# Patient Record
Sex: Female | Born: 1965 | Race: White | Hispanic: No | Smoking: Former smoker
Health system: Southern US, Community
[De-identification: ages and names within clinical notes are randomized; demographics above are authoritative.]

## PROBLEM LIST (undated history)

## (undated) DIAGNOSIS — K219 Gastro-esophageal reflux disease without esophagitis: Secondary | ICD-10-CM

## (undated) DIAGNOSIS — I10 Essential (primary) hypertension: Secondary | ICD-10-CM

## (undated) DIAGNOSIS — J3489 Other specified disorders of nose and nasal sinuses: Secondary | ICD-10-CM

## (undated) DIAGNOSIS — E079 Disorder of thyroid, unspecified: Secondary | ICD-10-CM

## (undated) HISTORY — PX: TUBAL LIGATION: SHX77

## (undated) HISTORY — PX: HAND SURGERY: SHX662

## (undated) HISTORY — DX: Essential (primary) hypertension: I10

---

## 2009-05-08 ENCOUNTER — Emergency Department (HOSPITAL_COMMUNITY): Admission: EM | Admit: 2009-05-08 | Discharge: 2009-05-08 | Payer: Self-pay | Admitting: Emergency Medicine

## 2010-02-01 ENCOUNTER — Emergency Department (HOSPITAL_COMMUNITY): Admission: EM | Admit: 2010-02-01 | Discharge: 2010-02-01 | Payer: Self-pay | Admitting: Emergency Medicine

## 2014-01-17 ENCOUNTER — Emergency Department (HOSPITAL_COMMUNITY): Payer: Self-pay

## 2014-01-17 ENCOUNTER — Emergency Department (HOSPITAL_COMMUNITY)
Admission: EM | Admit: 2014-01-17 | Discharge: 2014-01-17 | Disposition: A | Discharge status: Home / Self Care | Payer: Self-pay | Attending: Emergency Medicine | Admitting: Emergency Medicine

## 2014-01-17 ENCOUNTER — Encounter (HOSPITAL_COMMUNITY): Payer: Self-pay | Admitting: Emergency Medicine

## 2014-01-17 DIAGNOSIS — Z880 Allergy status to penicillin: Secondary | ICD-10-CM | POA: Insufficient documentation

## 2014-01-17 DIAGNOSIS — J069 Acute upper respiratory infection, unspecified: Secondary | ICD-10-CM | POA: Insufficient documentation

## 2014-01-17 DIAGNOSIS — M545 Low back pain, unspecified: Secondary | ICD-10-CM | POA: Insufficient documentation

## 2014-01-17 DIAGNOSIS — M5430 Sciatica, unspecified side: Secondary | ICD-10-CM | POA: Insufficient documentation

## 2014-01-17 DIAGNOSIS — F17200 Nicotine dependence, unspecified, uncomplicated: Secondary | ICD-10-CM | POA: Insufficient documentation

## 2014-01-17 MED ORDER — PREDNISONE 50 MG PO TABS
60.0000 mg | ORAL_TABLET | Freq: Once | ORAL | Status: AC
  Administered 2014-01-17: 60 mg via ORAL
  Filled 2014-01-17 (×2): qty 1

## 2014-01-17 MED ORDER — HYDROMORPHONE HCL PF 1 MG/ML IJ SOLN
1.0000 mg | Freq: Once | INTRAMUSCULAR | Status: AC
Start: 2014-01-17 — End: 2014-01-17
  Administered 2014-01-17: 1 mg via INTRAMUSCULAR
  Filled 2014-01-17: qty 1

## 2014-01-17 MED ORDER — BENZONATATE 200 MG PO CAPS: 200.0000 mg | ORAL_CAPSULE | Freq: Three times a day (TID) | ORAL | Status: DC | PRN

## 2014-01-17 MED ORDER — METHOCARBAMOL 500 MG PO TABS: 1000.0000 mg | ORAL_TABLET | Freq: Four times a day (QID) | ORAL | Status: AC | PRN

## 2014-01-17 MED ORDER — PREDNISONE 10 MG PO TABS: ORAL_TABLET | ORAL | Status: DC

## 2014-01-17 MED ORDER — BENZONATATE 100 MG PO CAPS
200.0000 mg | ORAL_CAPSULE | Freq: Once | ORAL | Status: AC
  Administered 2014-01-17: 200 mg via ORAL
  Filled 2014-01-17: qty 2

## 2014-01-17 MED ORDER — OXYCODONE-ACETAMINOPHEN 5-325 MG PO TABS: 1.0000 | ORAL_TABLET | ORAL | Status: DC | PRN

## 2014-01-17 NOTE — ED Notes (Signed)
Lower back pain worsening after back coughing spell.  Reports worsening pain radiating down leg with walking.

## 2014-01-17 NOTE — Discharge Instructions (Signed)
Cough, Adult  A cough is a reflex that helps clear your throat and airways. It can help heal the body or may be a reaction to an irritated airway. A cough may only last 2 or 3 weeks (acute) or may last more than 8 weeks (chronic).  CAUSES Acute cough:  Viral or bacterial infections. Chronic cough:  Infections.  Allergies.  Asthma.  Post-nasal drip.  Smoking.  Heartburn or acid reflux.  Some medicines.  Chronic lung problems (COPD).  Cancer. SYMPTOMS   Cough.  Fever.  Chest pain.  Increased breathing rate.  High-pitched whistling sound when breathing (wheezing).  Colored mucus that you cough up (sputum). TREATMENT   A bacterial cough may be treated with antibiotic medicine.  A viral cough must run its course and will not respond to antibiotics.  Your caregiver may recommend other treatments if you have a chronic cough. HOME CARE INSTRUCTIONS   Only take over-the-counter or prescription medicines for pain, discomfort, or fever as directed by your caregiver. Use cough suppressants only as directed by your caregiver.  Use a cold steam vaporizer or humidifier in your bedroom or home to help loosen secretions.  Sleep in a semi-upright position if your cough is worse at night.  Rest as needed.  Stop smoking if you smoke. SEEK IMMEDIATE MEDICAL CARE IF:   You have pus in your sputum.  Your cough starts to worsen.  You cannot control your cough with suppressants and are losing sleep.  You begin coughing up blood.  You have difficulty breathing.  You develop pain which is getting worse or is uncontrolled with medicine.  You have a fever. MAKE SURE YOU:   Understand these instructions.  Will watch your condition.  Will get help right away if you are not doing well or get worse. Document Released: 11/22/2010 Document Revised: 08/18/2011 Document Reviewed: 11/22/2010 [facility] Patient Information 2015 [place], [place]. This information is not intended  to replace advice given to you by your health care provider. Make sure you discuss any questions you have with your health care provider.  Sciatica Sciatica is pain, weakness, numbness, or tingling along the path of the sciatic nerve. The nerve starts in the lower back and runs down the back of each leg. The nerve controls the muscles in the lower leg and in the back of the knee, while also providing sensation to the back of the thigh, lower leg, and the sole of your foot. Sciatica is a symptom of another medical condition. For instance, nerve damage or certain conditions, such as a herniated disk or bone spur on the spine, pinch or put pressure on the sciatic nerve. This causes the pain, weakness, or other sensations normally associated with sciatica. Generally, sciatica only affects one side of the body. CAUSES   Herniated or slipped disc.  Degenerative disk disease.  A pain disorder involving the narrow muscle in the buttocks (piriformis syndrome).  Pelvic injury or fracture.  Pregnancy.  Tumor (rare). SYMPTOMS  Symptoms can vary from mild to very severe. The symptoms usually travel from the low back to the buttocks and down the back of the leg. Symptoms can include:  Mild tingling or dull aches in the lower back, leg, or hip.  Numbness in the back of the calf or sole of the foot.  Burning sensations in the lower back, leg, or hip.  Sharp pains in the lower back, leg, or hip.  Leg weakness.  Severe back pain inhibiting movement. These symptoms may get worse  with coughing, sneezing, laughing, or prolonged sitting or standing. Also, being overweight may worsen symptoms. DIAGNOSIS  Your caregiver will perform a physical exam to look for common symptoms of sciatica. He or she may ask you to do certain movements or activities that would trigger sciatic nerve pain. Other tests may be performed to find the cause of the sciatica. These may include:  Blood tests.  X-rays.  Imaging  tests, such as an MRI or CT scan. TREATMENT  Treatment is directed at the cause of the sciatic pain. Sometimes, treatment is not necessary and the pain and discomfort goes away on its own. If treatment is needed, your caregiver may suggest:  Over-the-counter medicines to relieve pain.  Prescription medicines, such as anti-inflammatory medicine, muscle relaxants, or narcotics.  Applying heat or ice to the painful area.  Steroid injections to lessen pain, irritation, and inflammation around the nerve.  Reducing activity during periods of pain.  Exercising and stretching to strengthen your abdomen and improve flexibility of your spine. Your caregiver may suggest losing weight if the extra weight makes the back pain worse.  Physical therapy.  Surgery to eliminate what is pressing or pinching the nerve, such as a bone spur or part of a herniated disk. HOME CARE INSTRUCTIONS   Only take over-the-counter or prescription medicines for pain or discomfort as directed by your caregiver.  Apply ice to the affected area for 20 minutes, 3-4 times a day for the first 48-72 hours. Then try heat in the same way.  Exercise, stretch, or perform your usual activities if these do not aggravate your pain.  Attend physical therapy sessions as directed by your caregiver.  Keep all follow-up appointments as directed by your caregiver.  Do not wear high heels or shoes that do not provide proper support.  Check your mattress to see if it is too soft. A firm mattress may lessen your pain and discomfort. SEEK IMMEDIATE MEDICAL CARE IF:   You lose control of your bowel or bladder (incontinence).  You have increasing weakness in the lower back, pelvis, buttocks, or legs.  You have redness or swelling of your back.  You have a burning sensation when you urinate.  You have pain that gets worse when you lie down or awakens you at night.  Your pain is worse than you have experienced in the past.  Your  pain is lasting longer than 4 weeks.  You are suddenly losing weight without reason. MAKE SURE YOU:  Understand these instructions.  Will watch your condition.  Will get help right away if you are not doing well or get worse. Document Released: 05/20/2001 Document Revised: 11/25/2011 Document Reviewed: 10/05/2011 [facility] Patient Information 2015 [place], [place]. This information is not intended to replace advice given to you by your health care provider. Make sure you discuss any questions you have with your health care provider.   Take your next dose of prednisone tomorrow morning.  Use the the other medicines as directed.  Do not drive within 4 hours of taking oxycodone as this will make you drowsy.  Avoid lifting,  Bending,  Twisting or any other activity that worsens your pain over the next week.  Apply an  icepack  to your lower back for 10-15 minutes every 2 hours for the next 2 days.  You should get rechecked if your symptoms are not better over the next 5 days,  Or you develop increased pain,  Weakness in your leg(s) or loss of bladder or bowel  function - these are symptoms of a worse injury.  As discussed, honey is also an excellent cough suppressant.  Stay well hydrated,  Cough lozenges,  Warm salt gargles can also help improve throat irritation.

## 2014-01-18 NOTE — ED Provider Notes (Signed)
CSN: [id number]     Arrival date & time 01/17/14  1237 History   First MD Initiated Contact with Patient 01/17/14 1256     Chief Complaint  Patient presents with  . Back Pain     (Consider location/radiation/quality/duration/timing/severity/associated sxs/prior Treatment) The history is provided by the patient and the spouse.    Melissa Meyers is a 48 y.o. female  with a history of intermittent mild low back pain presenting with severe and sudden worse low back pain after a coughing spell this morning.  She she feels her back "locked up" suddenly while coughing.  Her pain is radiating down both posterior legs to her calves but  denies weakness in her legs, nor has she had bladder or bowel retention or incontinence since this event.  Her cough has been fairly persistent this past week, dry and she denies shortness of breath.  She had URI type symptoms this past week with nasal congestion which has improved but has residual nonproductive coughing which has not improved with OTC cough medications.  She has a daily smoker.  She denies fevers or chills, chest pain or shortness of breath.           History reviewed. No pertinent past medical history. Past Surgical History  Procedure Laterality Date  . Tubal ligation     No family history on file. History  Substance Use Topics  . Smoking status: Current Every Day Smoker    Types: Cigarettes  . Smokeless tobacco: Not on file  . Alcohol Use: No   OB History   Grav Para Term Preterm Abortions TAB SAB Ect Mult Living                 Review of Systems  Constitutional: Negative for fever and chills.  Respiratory: Positive for cough. Negative for shortness of breath, wheezing and stridor.   Cardiovascular: Negative for chest pain and leg swelling.  Gastrointestinal: Negative for abdominal pain, constipation and abdominal distention.  Genitourinary: Negative for dysuria, urgency, frequency, flank pain and difficulty urinating.   Musculoskeletal: Positive for back pain. Negative for gait problem and joint swelling.  Skin: Negative for rash.  Neurological: Negative for weakness and numbness.      Allergies  Penicillins  Home Medications   Prior to Admission medications   Medication Sig Start Date End Date Taking? Authorizing Provider  benzonatate (TESSALON) 200 MG capsule Take 1 capsule (200 mg total) by mouth 3 (three) times daily as needed for cough. 01/17/14   Burgess Amor, PA-C  methocarbamol (ROBAXIN) 500 MG tablet Take 2 tablets (1,000 mg total) by mouth every 6 (six) hours as needed for muscle spasms. 01/17/14 01/27/14  Burgess Amor, PA-C  oxyCODONE-acetaminophen (PERCOCET/ROXICET) 5-325 MG per tablet Take 1 tablet by mouth every 4 (four) hours as needed. 01/17/14   Burgess Amor, PA-C  predniSONE (DELTASONE) 10 MG tablet 6, 5, 4, 3, 2 then 1 tablet by mouth daily for 6 days total. 01/17/14   Burgess Amor, PA-C   BP 123/87  Pulse 83  Temp(Src) 99 F (37.2 C) (Oral)  Resp 20  Ht 5\' 2"  (1.575 m)  Wt 136 lb (61.689 kg)  BMI 24.87 kg/m2  SpO2 98%  LMP 11/23/2013 Physical Exam  Nursing note and vitals reviewed. Constitutional: She appears well-developed and well-nourished.  HENT:  Head: Normocephalic.  Eyes: Conjunctivae are normal.  Neck: Normal range of motion. Neck supple.  Cardiovascular: Normal rate and intact distal pulses.   Pedal pulses normal.  Pulmonary/Chest:  Effort normal. She has no wheezes. She exhibits no tenderness.  Abdominal: Soft. Bowel sounds are normal. She exhibits no distension and no mass.  Musculoskeletal: Normal range of motion. She exhibits no edema.       Lumbar back: She exhibits tenderness. She exhibits no swelling, no edema and no spasm.  Lumber and bilateral paralumbar ttp without step offs, no deformity.  Muscle spasm.  Neurological: She is alert. She has normal strength. She displays no atrophy and no tremor. No sensory deficit. Gait normal.  Reflex Scores:      Patellar  reflexes are 2+ on the right side and 2+ on the left side.      Achilles reflexes are 2+ on the right side and 2+ on the left side. No strength deficit noted in hip and knee flexor and extensor muscle groups.  Ankle flexion and extension intact.  Skin: Skin is warm and dry.  Psychiatric: She has a normal mood and affect.    ED Course  Procedures (including critical care time) Labs Review Labs Reviewed - No data to display  Imaging Review Dg Chest 2 View  01/17/2014   CLINICAL DATA:  Cough  EXAM: CHEST  2 VIEW  COMPARISON:  None.  FINDINGS: Lungs are clear. Heart size and pulmonary vascularity are normal. No adenopathy. No bone lesions.  IMPRESSION: No edema or consolidation.   Electronically Signed   By: Bretta BangWilliam  Woodruff M.D.   On: 01/17/2014 14:25   Dg Lumbar Spine Complete  01/17/2014   CLINICAL DATA:  Lower back pain worse with sitting  EXAM: LUMBAR SPINE - COMPLETE 4+ VIEW  COMPARISON:  None  FINDINGS: No acute fracture or malalignment. Vertebral body heights are maintained. There may be very mild degenerative disc disease at L4-L5 and L5-S1. Normal bony mineralization. No lytic or blastic osseous lesion. Atherosclerotic calcifications present within the abdominal aorta. Visualized bowel gas pattern is unremarkable.  IMPRESSION: 1. Mild degenerative disc disease at L4-L5 and L5-S1. 2. Aortic atherosclerosis.   Electronically Signed   By: Malachy MoanHeath  McCullough M.D.   On: 01/17/2014 14:27     EKG Interpretation None      MDM   Final diagnoses:  Sciatica, unspecified laterality  URI, acute    Patients labs and/or radiological studies were viewed and considered during the medical decision making and disposition process.  No neuro deficit on exam or by history to suggest emergent or surgical presentation.  Also discussed worsened sx that should prompt immediate re-evaluation including distal weakness, bowel/bladder retention/incontinence.  Patient was prescribed Tessalon to help with  her coughing, discussed smoking cessation.  She is also prescribed a prednisone taper, Robaxin and oxycodone for low back pain.  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  She was encouraged to followup with her orthopedic specialist regarding her back pain if her symptoms persist.         Burgess AmorJulie Shannen Vernon, PA-C 01/18/14 1759

## 2014-01-19 NOTE — ED Provider Notes (Signed)
Medical screening examination/treatment/procedure(s) were performed by non-physician practitioner and as supervising physician I was immediately available for consultation/collaboration.   EKG Interpretation None        Benny LennertJoseph L Amari Burnsworth, MD 01/19/14 1930

## 2014-10-12 ENCOUNTER — Encounter (HOSPITAL_COMMUNITY): Payer: Self-pay | Admitting: Emergency Medicine

## 2014-10-12 ENCOUNTER — Emergency Department (HOSPITAL_COMMUNITY)
Admission: EM | Admit: 2014-10-12 | Discharge: 2014-10-12 | Disposition: A | Discharge status: Home / Self Care | Payer: 59 | Attending: Emergency Medicine | Admitting: Emergency Medicine

## 2014-10-12 DIAGNOSIS — Y9289 Other specified places as the place of occurrence of the external cause: Secondary | ICD-10-CM | POA: Insufficient documentation

## 2014-10-12 DIAGNOSIS — Y998 Other external cause status: Secondary | ICD-10-CM | POA: Diagnosis not present

## 2014-10-12 DIAGNOSIS — E079 Disorder of thyroid, unspecified: Secondary | ICD-10-CM | POA: Insufficient documentation

## 2014-10-12 DIAGNOSIS — Y9389 Activity, other specified: Secondary | ICD-10-CM | POA: Insufficient documentation

## 2014-10-12 DIAGNOSIS — Z880 Allergy status to penicillin: Secondary | ICD-10-CM | POA: Diagnosis not present

## 2014-10-12 DIAGNOSIS — S46912A Strain of unspecified muscle, fascia and tendon at shoulder and upper arm level, left arm, initial encounter: Secondary | ICD-10-CM | POA: Insufficient documentation

## 2014-10-12 DIAGNOSIS — S4992XA Unspecified injury of left shoulder and upper arm, initial encounter: Secondary | ICD-10-CM | POA: Diagnosis present

## 2014-10-12 DIAGNOSIS — Z720 Tobacco use: Secondary | ICD-10-CM | POA: Diagnosis not present

## 2014-10-12 DIAGNOSIS — Z79899 Other long term (current) drug therapy: Secondary | ICD-10-CM | POA: Insufficient documentation

## 2014-10-12 DIAGNOSIS — S46812A Strain of other muscles, fascia and tendons at shoulder and upper arm level, left arm, initial encounter: Secondary | ICD-10-CM

## 2014-10-12 DIAGNOSIS — X58XXXA Exposure to other specified factors, initial encounter: Secondary | ICD-10-CM | POA: Diagnosis not present

## 2014-10-12 DIAGNOSIS — M7712 Lateral epicondylitis, left elbow: Secondary | ICD-10-CM | POA: Insufficient documentation

## 2014-10-12 DIAGNOSIS — Z7951 Long term (current) use of inhaled steroids: Secondary | ICD-10-CM | POA: Diagnosis not present

## 2014-10-12 DIAGNOSIS — S59902A Unspecified injury of left elbow, initial encounter: Secondary | ICD-10-CM | POA: Insufficient documentation

## 2014-10-12 DIAGNOSIS — K219 Gastro-esophageal reflux disease without esophagitis: Secondary | ICD-10-CM | POA: Diagnosis not present

## 2014-10-12 DIAGNOSIS — J3489 Other specified disorders of nose and nasal sinuses: Secondary | ICD-10-CM | POA: Diagnosis not present

## 2014-10-12 HISTORY — DX: Other specified disorders of nose and nasal sinuses: J34.89

## 2014-10-12 HISTORY — DX: Disorder of thyroid, unspecified: E07.9

## 2014-10-12 HISTORY — DX: Gastro-esophageal reflux disease without esophagitis: K21.9

## 2014-10-12 MED ORDER — ONDANSETRON HCL 4 MG PO TABS
4.0000 mg | ORAL_TABLET | Freq: Once | ORAL | Status: DC
  Filled 2014-10-12: qty 1

## 2014-10-12 MED ORDER — HYDROCODONE-ACETAMINOPHEN 5-325 MG PO TABS: 1.0000 | ORAL_TABLET | ORAL | Status: DC | PRN

## 2014-10-12 MED ORDER — DICLOFENAC SODIUM 75 MG PO TBEC: 75.0000 mg | DELAYED_RELEASE_TABLET | Freq: Two times a day (BID) | ORAL | Status: DC

## 2014-10-12 MED ORDER — PREDNISONE 10 MG PO TABS
60.0000 mg | ORAL_TABLET | Freq: Once | ORAL | Status: DC
  Filled 2014-10-12 (×2): qty 1

## 2014-10-12 MED ORDER — METHOCARBAMOL 500 MG PO TABS: 500.0000 mg | ORAL_TABLET | Freq: Three times a day (TID) | ORAL | Status: DC

## 2014-10-12 MED ORDER — KETOROLAC TROMETHAMINE 10 MG PO TABS
10.0000 mg | ORAL_TABLET | Freq: Once | ORAL | Status: DC
  Filled 2014-10-12: qty 1
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
## 2014-10-12 NOTE — Discharge Instructions (Signed)
Please use a heating pad to your shoulder and elbow. Please use medications as prescribed. The Robaxin and Norco may cause drowsiness, please use these medications with caution. Please see Melissa Meyers as soon as possible in the office for evaluation and management of your shoulder and elbow issues. Tennis Elbow Tennis elbow can happen in any sport or job where you overuse your elbow. It is caused by doing the same motion over and over. This makes small tears in the forearm muscles. It can also cause muscle redness, soreness, and puffiness (inflammation). HOME CARE  Rest.  Use your other hand or arm that is not affected. Change your grip.  Only take medicine as told by your doctor.  Put ice on the area after activity.  Put ice in a plastic bag.  Place a towel between your skin and the bag.  Leave the ice on for 15-20 minutes, 03-04 times a day.  Wear a splint or sling as told by your doctor. This lets the area rest and heal faster. GET HELP RIGHT AWAY IF: Your pain gets worse or does not improve in 2 weeks even with treatment. MAKE SURE YOU:   Understand these instructions.  Will watch your condition.  Will get help right away if you are not doing well or get worse. Document Released: 11/13/2009 Document Revised: 08/18/2011 Document Reviewed: 11/13/2009 [facility] Patient Information 2015 [place], [place]. This information is not intended to replace advice given to you by your health care provider. Make sure you discuss any questions you have with your health care provider.  Muscle Strain A muscle strain (pulled muscle) happens when a muscle is stretched beyond normal length. It happens when a sudden, violent force stretches your muscle too far. Usually, a few of the fibers in your muscle are torn. Muscle strain is common in athletes. Recovery usually takes 1-2 weeks. Complete healing takes 5-6 weeks.  HOME CARE   Follow the PRICE method of treatment to help your injury get better.  Do this the first 2-3 days after the injury:  Protect. Protect the muscle to keep it from getting injured again.  Rest. Limit your activity and rest the injured body part.  Ice. Put ice in a plastic bag. Place a towel between your skin and the bag. Then, apply the ice and leave it on from 15-20 minutes each hour. After the third day, switch to moist heat packs.  Compression. Use a splint or elastic bandage on the injured area for comfort. Do not put it on too tightly.  Elevate. Keep the injured body part above the level of your heart.  Only take medicine as told by your doctor.  Warm up before doing exercise to prevent future muscle strains. GET HELP IF:   You have more pain or puffiness (swelling) in the injured area.  You feel numbness, tingling, or notice a loss of strength in the injured area. MAKE SURE YOU:   Understand these instructions.  Will watch your condition.  Will get help right away if you are not doing well or get worse. Document Released: 03/04/2008 Document Revised: 03/16/2013 Document Reviewed: 12/23/2012 [facility] Patient Information 2015 [place], [place]. This information is not intended to replace advice given to you by your health care provider. Make sure you discuss any questions you have with your health care provider.

## 2014-11-07 ENCOUNTER — Other Ambulatory Visit (HOSPITAL_COMMUNITY): Payer: Self-pay | Admitting: Pulmonary Disease

## 2014-11-07 ENCOUNTER — Ambulatory Visit (HOSPITAL_COMMUNITY)
Admission: RE | Admit: 2014-11-07 | Discharge: 2014-11-07 | Disposition: A | Discharge status: Home / Self Care | Payer: 59 | Source: Ambulatory Visit | Attending: Pulmonary Disease | Admitting: Pulmonary Disease

## 2014-11-07 DIAGNOSIS — M47892 Other spondylosis, cervical region: Secondary | ICD-10-CM | POA: Insufficient documentation

## 2014-11-07 DIAGNOSIS — M542 Cervicalgia: Secondary | ICD-10-CM | POA: Diagnosis present

## 2014-11-07 DIAGNOSIS — M5412 Radiculopathy, cervical region: Secondary | ICD-10-CM

## 2014-11-07 DIAGNOSIS — M47814 Spondylosis without myelopathy or radiculopathy, thoracic region: Secondary | ICD-10-CM | POA: Insufficient documentation

## 2014-11-07 DIAGNOSIS — M5134 Other intervertebral disc degeneration, thoracic region: Secondary | ICD-10-CM | POA: Insufficient documentation

## 2014-11-07 DIAGNOSIS — M25522 Pain in left elbow: Secondary | ICD-10-CM | POA: Insufficient documentation

## 2014-11-07 DIAGNOSIS — M5032 Other cervical disc degeneration, mid-cervical region: Secondary | ICD-10-CM | POA: Diagnosis not present

## 2014-12-09 ENCOUNTER — Encounter (HOSPITAL_COMMUNITY): Payer: Self-pay | Admitting: Emergency Medicine

## 2014-12-09 ENCOUNTER — Emergency Department (HOSPITAL_COMMUNITY)
Admission: EM | Admit: 2014-12-09 | Discharge: 2014-12-09 | Disposition: A | Discharge status: Home / Self Care | Payer: 59 | Attending: Emergency Medicine | Admitting: Emergency Medicine

## 2014-12-09 DIAGNOSIS — Z720 Tobacco use: Secondary | ICD-10-CM | POA: Diagnosis not present

## 2014-12-09 DIAGNOSIS — Z79899 Other long term (current) drug therapy: Secondary | ICD-10-CM | POA: Diagnosis not present

## 2014-12-09 DIAGNOSIS — E079 Disorder of thyroid, unspecified: Secondary | ICD-10-CM | POA: Insufficient documentation

## 2014-12-09 DIAGNOSIS — W228XXA Striking against or struck by other objects, initial encounter: Secondary | ICD-10-CM | POA: Insufficient documentation

## 2014-12-09 DIAGNOSIS — Y9289 Other specified places as the place of occurrence of the external cause: Secondary | ICD-10-CM | POA: Diagnosis not present

## 2014-12-09 DIAGNOSIS — Y998 Other external cause status: Secondary | ICD-10-CM | POA: Insufficient documentation

## 2014-12-09 DIAGNOSIS — Z7951 Long term (current) use of inhaled steroids: Secondary | ICD-10-CM | POA: Insufficient documentation

## 2014-12-09 DIAGNOSIS — K219 Gastro-esophageal reflux disease without esophagitis: Secondary | ICD-10-CM | POA: Diagnosis not present

## 2014-12-09 DIAGNOSIS — S0501XA Injury of conjunctiva and corneal abrasion without foreign body, right eye, initial encounter: Secondary | ICD-10-CM | POA: Diagnosis not present

## 2014-12-09 DIAGNOSIS — S0591XA Unspecified injury of right eye and orbit, initial encounter: Secondary | ICD-10-CM | POA: Diagnosis present

## 2014-12-09 DIAGNOSIS — Y9389 Activity, other specified: Secondary | ICD-10-CM | POA: Insufficient documentation

## 2014-12-09 MED ORDER — FLUORESCEIN SODIUM 1 MG OP STRP
1.0000 | ORAL_STRIP | Freq: Once | OPHTHALMIC | Status: AC
  Administered 2014-12-09: 1 via OPHTHALMIC
  Filled 2014-12-09: qty 1

## 2014-12-09 MED ORDER — HYDROCODONE-ACETAMINOPHEN 5-325 MG PO TABS: 1.0000 | ORAL_TABLET | ORAL | Status: DC | PRN

## 2014-12-09 MED ORDER — ERYTHROMYCIN 5 MG/GM OP OINT
TOPICAL_OINTMENT | Freq: Once | OPHTHALMIC | Status: AC
  Administered 2014-12-09: 1 via OPHTHALMIC
  Filled 2014-12-09: qty 3.5

## 2014-12-09 MED ORDER — TETRACAINE HCL 0.5 % OP SOLN
1.0000 [drp] | Freq: Once | OPHTHALMIC | Status: AC
  Administered 2014-12-09: 1 [drp] via OPHTHALMIC
  Filled 2014-12-09: qty 2

## 2014-12-09 NOTE — ED Notes (Signed)
Pt verbalized understanding of no driving and to use caution within 4 hours of taking pain meds due to meds cause drowsiness 

## 2014-12-09 NOTE — ED Provider Notes (Signed)
CSN: [id number]     Arrival date & time 12/09/14  1906 History   First MD Initiated Contact with Patient 12/09/14 1932     Chief Complaint  Patient presents with  . Eye Injury     (Consider location/radiation/quality/duration/timing/severity/associated sxs/prior Treatment) Patient is a 49 y.o. female presenting with eye injury. The history is provided by the patient.  Eye Injury This is a new problem. The current episode started today. The problem occurs constantly. The problem has been unchanged. Associated symptoms include a visual change. Pertinent negatives include no chest pain, chills, congestion, coughing, fever or nausea. Associated symptoms comments: Patient endorses blurred vision, increased eye watering, continued foreign body sensation which is worsened with blinking.. Exacerbated by: Blinking and bright lights. She has tried nothing for the symptoms.   Patient was walking down her driveway when she was struck in the eye by an object which flew out of a lawnmower, possibly a rock.  The injury occurred prior to arrival here.   Past Medical History  Diagnosis Date  . Acid reflux   . Sinus drainage   . Thyroid disease    Past Surgical History  Procedure Laterality Date  . Tubal ligation     No family history on file. History  Substance Use Topics  . Smoking status: Current Every Day Smoker -- 1.00 packs/day    Types: Cigarettes  . Smokeless tobacco: Not on file  . Alcohol Use: No   OB History    No data available     Review of Systems  Constitutional: Negative for fever and chills.  HENT: Positive for rhinorrhea. Negative for congestion, ear pain, facial swelling, trouble swallowing and voice change.   Eyes: Positive for photophobia, pain and visual disturbance. Negative for discharge.  Respiratory: Negative for cough, shortness of breath, wheezing and stridor.   Cardiovascular: Negative for chest pain.  Gastrointestinal: Negative for nausea.  Genitourinary:  Negative.       Allergies  Penicillins  Home Medications   Prior to Admission medications   Medication Sig Start Date End Date Taking? Authorizing Provider  fluticasone (FLONASE) 50 MCG/ACT nasal spray Place 2 sprays into both nostrils at bedtime.    Yes Historical Provider, MD  levothyroxine (SYNTHROID, LEVOTHROID) 50 MCG tablet Take 50 mcg by mouth daily before breakfast.   Yes Historical Provider, MD  omeprazole (PRILOSEC) 20 MG capsule Take 40 mg by mouth 2 (two) times daily.   Yes Historical Provider, MD  benzonatate (TESSALON) 200 MG capsule Take 1 capsule (200 mg total) by mouth 3 (three) times daily as needed for cough. Patient not taking: Reported on 10/12/2014 01/17/14   Burgess Amor, PA-C  diclofenac (VOLTAREN) 75 MG EC tablet Take 1 tablet (75 mg total) by mouth 2 (two) times daily. 10/12/14   Ivery Quale, PA-C  HYDROcodone-acetaminophen (NORCO/VICODIN) 5-325 MG per tablet Take 1 tablet by mouth every 4 (four) hours as needed. 12/09/14   Burgess Amor, PA-C  methocarbamol (ROBAXIN) 500 MG tablet Take 1 tablet (500 mg total) by mouth 3 (three) times daily. 10/12/14   Ivery Quale, PA-C  oxyCODONE-acetaminophen (PERCOCET/ROXICET) 5-325 MG per tablet Take 1 tablet by mouth every 4 (four) hours as needed. Patient not taking: Reported on 10/12/2014 01/17/14   Burgess Amor, PA-C  predniSONE (DELTASONE) 10 MG tablet 6, 5, 4, 3, 2 then 1 tablet by mouth daily for 6 days total. Patient not taking: Reported on 10/12/2014 01/17/14   Burgess Amor, PA-C   BP 131/79 mmHg  Pulse 92  Temp(Src) 98.1 F (36.7 C) (Oral)  Resp 18  SpO2 99% Physical Exam  Constitutional: She is oriented to person, place, and time. She appears well-developed and well-nourished.  HENT:  Head: Normocephalic and atraumatic.  Right Ear: Tympanic membrane and ear canal normal.  Left Ear: Tympanic membrane and ear canal normal.  Nose: Mucosal edema and rhinorrhea present.  Mouth/Throat: Uvula is midline, oropharynx is clear and  moist and mucous membranes are normal. No oropharyngeal exudate, posterior oropharyngeal edema, posterior oropharyngeal erythema or tonsillar abscesses.  Eyes: Conjunctivae and EOM are normal. Pupils are equal, round, and reactive to light.  Slit lamp exam:      The right eye shows corneal abrasion and fluorescein uptake. The right eye shows no foreign body.    Per diagram, there are 3 small areas of abrasion.  She has a round dark hyperpigmented area at the 3:00 position of her right iris.  Patient reports this is a birthmark.  Cardiovascular: Normal rate and normal heart sounds.   Pulmonary/Chest: Effort normal. No respiratory distress. She has no wheezes. She has no rales.  Abdominal: Soft. There is no tenderness.  Musculoskeletal: Normal range of motion.  Neurological: She is alert and oriented to person, place, and time.  Skin: Skin is warm and dry. No rash noted.  Psychiatric: She has a normal mood and affect.   Bilateral Near: 20/50 (pt states that she wears glasses, done without her corrective glasses in place ) ; R Near: 20/200 ; L Near: 20/30 ED Course  Procedures (including critical care time) Labs Review Labs Reviewed - No data to display  Imaging Review No results found.   EKG Interpretation None      MDM   Final diagnoses:  Corneal abrasion, right, initial encounter    Pt was placed on erythromycin ophthalmic ointment.  Prescribed hydrocodone.  She is up-to-date on her tetanus.  Advise follow-up with orthopedics early this week for recheck, referral was given to Dr. Bing PlumeHaynes.    Burgess AmorJulie Arthelia Callicott, PA-C 12/09/14 2029  Samuel JesterKathleen McManus, DO 12/12/14 [phone]

## 2014-12-09 NOTE — ED Notes (Signed)
Pt. Reports family member was mowing grass and rock flew up and hit her in the right eye.

## 2014-12-09 NOTE — Discharge Instructions (Signed)
Corneal Abrasion The cornea is the clear covering at the front and center of the eye. When looking at the colored portion of the eye (iris), you are looking through the cornea. This very thin tissue is made up of many layers. The surface layer is a single layer of cells (corneal epithelium) and is one of the most sensitive tissues in the body. If a scratch or injury causes the corneal epithelium to come off, it is called a corneal abrasion. If the injury extends to the tissues below the epithelium, the condition is called a corneal ulcer. CAUSES   Scratches.  Trauma.  Foreign body in the eye. Some people have recurrences of abrasions in the area of the original injury even after it has healed (recurrent erosion syndrome). Recurrent erosion syndrome generally improves and goes away with time. SYMPTOMS   Eye pain.  Difficulty or inability to keep the injured eye open.  The eye becomes very sensitive to light.  Recurrent erosions tend to happen suddenly, first thing in the morning, usually after waking up and opening the eye. DIAGNOSIS  Your health care provider can diagnose a corneal abrasion during an eye exam. Dye is usually placed in the eye using a drop or a small paper strip moistened by your tears. When the eye is examined with a special light, the abrasion shows up clearly because of the dye. TREATMENT   Small abrasions may be treated with antibiotic drops or ointment alone.  A pressure patch may be put over the eye. If this is done, follow your doctor's instructions for when to remove the patch. Do not drive or use machines while the eye patch is on. Judging distances is hard to do with a patch on. If the abrasion becomes infected and spreads to the deeper tissues of the cornea, a corneal ulcer can result. This is serious because it can cause corneal scarring. Corneal scars interfere with light passing through the cornea and cause a loss of vision in the involved eye. HOME CARE  INSTRUCTIONS  Use medicine or ointment as directed. Only take over-the-counter or prescription medicines for pain, discomfort, or fever as directed by your health care provider.  Do not drive or operate machinery if your eye is patched. Your ability to judge distances is impaired.  If your health care provider has given you a follow-up appointment, it is very important to keep that appointment. Not keeping the appointment could result in a severe eye infection or permanent loss of vision. If there is any problem keeping the appointment, let your health care provider know. SEEK MEDICAL CARE IF:   You have pain, light sensitivity, and a scratchy feeling in one eye or both eyes.  Your pressure patch keeps loosening up, and you can blink your eye under the patch after treatment.  Any kind of discharge develops from the eye after treatment or if the lids stick together in the morning.  You have the same symptoms in the morning as you did with the original abrasion days, weeks, or months after the abrasion healed. MAKE SURE YOU:   Understand these instructions.  Will watch your condition.  Will get help right away if you are not doing well or get worse. Document Released: 05/23/2000 Document Revised: 05/31/2013 Document Reviewed: 01/31/2013 [facility] Patient Information 2015 [place], [place]. This information is not intended to replace advice given to you by your health care provider. Make sure you discuss any questions you have with your health care provider.  Applied antibiotic  ointment to the right eye 4 times daily.  You may use the eye patch given that only for the first 24 hours.  You may take the hydrocodone prescribed for additional pain relief, but use caution as medication will make you drowsy.  Do not drive within 4 hours of taking this medication.

## 2014-12-13 ENCOUNTER — Other Ambulatory Visit (HOSPITAL_COMMUNITY): Payer: Self-pay | Admitting: Pulmonary Disease

## 2014-12-13 DIAGNOSIS — M25512 Pain in left shoulder: Principal | ICD-10-CM

## 2014-12-13 DIAGNOSIS — M25511 Pain in right shoulder: Secondary | ICD-10-CM

## 2014-12-21 MED FILL — Hydrocodone-Acetaminophen Tab 5-325 MG: ORAL | Qty: 6 | Status: AC

## 2014-12-27 ENCOUNTER — Ambulatory Visit (HOSPITAL_COMMUNITY)
Admission: RE | Admit: 2014-12-27 | Discharge: 2014-12-27 | Disposition: A | Discharge status: Home / Self Care | Payer: 59 | Source: Ambulatory Visit | Attending: Pulmonary Disease | Admitting: Pulmonary Disease

## 2014-12-27 DIAGNOSIS — M5022 Other cervical disc displacement, mid-cervical region: Secondary | ICD-10-CM | POA: Diagnosis not present

## 2014-12-27 DIAGNOSIS — M542 Cervicalgia: Secondary | ICD-10-CM | POA: Insufficient documentation

## 2014-12-27 DIAGNOSIS — M25512 Pain in left shoulder: Secondary | ICD-10-CM

## 2014-12-27 DIAGNOSIS — M25511 Pain in right shoulder: Secondary | ICD-10-CM

## 2015-01-23 ENCOUNTER — Ambulatory Visit (INDEPENDENT_AMBULATORY_CARE_PROVIDER_SITE_OTHER): Payer: 59 | Admitting: Orthopedic Surgery

## 2015-01-23 VITALS — BP 105/63 | Ht 64.0 in | Wt 146.0 lb

## 2015-01-23 DIAGNOSIS — M75102 Unspecified rotator cuff tear or rupture of left shoulder, not specified as traumatic: Secondary | ICD-10-CM

## 2015-01-23 DIAGNOSIS — M7712 Lateral epicondylitis, left elbow: Secondary | ICD-10-CM

## 2015-01-23 DIAGNOSIS — M47812 Spondylosis without myelopathy or radiculopathy, cervical region: Secondary | ICD-10-CM | POA: Diagnosis not present

## 2015-01-23 MED ORDER — NABUMETONE 500 MG PO TABS: 500.0000 mg | ORAL_TABLET | Freq: Two times a day (BID) | ORAL | Status: DC

## 2015-01-24 ENCOUNTER — Encounter: Payer: Self-pay | Admitting: Orthopedic Surgery
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
## 2015-02-01 ENCOUNTER — Ambulatory Visit (HOSPITAL_COMMUNITY): Payer: 59 | Attending: Orthopedic Surgery | Admitting: Occupational Therapy

## 2015-02-01 ENCOUNTER — Encounter (HOSPITAL_COMMUNITY): Payer: Self-pay | Admitting: Occupational Therapy

## 2015-02-01 DIAGNOSIS — M6289 Other specified disorders of muscle: Secondary | ICD-10-CM

## 2015-02-01 DIAGNOSIS — M629 Disorder of muscle, unspecified: Secondary | ICD-10-CM | POA: Diagnosis present

## 2015-02-01 DIAGNOSIS — M6281 Muscle weakness (generalized): Secondary | ICD-10-CM | POA: Diagnosis present

## 2015-02-01 DIAGNOSIS — M7582 Other shoulder lesions, left shoulder: Secondary | ICD-10-CM | POA: Insufficient documentation

## 2015-02-01 DIAGNOSIS — M25512 Pain in left shoulder: Secondary | ICD-10-CM | POA: Insufficient documentation

## 2015-02-01 DIAGNOSIS — M75102 Unspecified rotator cuff tear or rupture of left shoulder, not specified as traumatic: Secondary | ICD-10-CM | POA: Insufficient documentation

## 2015-02-01 DIAGNOSIS — M25612 Stiffness of left shoulder, not elsewhere classified: Secondary | ICD-10-CM

## 2015-02-01 NOTE — Patient Instructions (Signed)
SHOULDER: Flexion On Table   Place hands on table, elbows straight. Move hips away from body. Press hands down into table.  _10-15__ reps per set, _1-2__ sets per day  Abduction (Passive)   With arm out to side, resting on table, lower head toward arm, keeping trunk away from table.  Repeat _10-15___ times. Do _1-2___ sessions per day.  Copyright  VHI. All rights reserved.     Internal Rotation (Assistive)   Seated with elbow bent at right angle and held against side, slide arm on table surface in an inward arc. Repeat __10-15__ times. Do _1-2___ sessions per day. Activity: Use this motion to brush crumbs off the table.  Copyright  VHI. All rights reserved.    
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
## 2015-02-06 ENCOUNTER — Encounter (HOSPITAL_COMMUNITY): Payer: 59 | Admitting: Specialist

## 2015-02-08 ENCOUNTER — Ambulatory Visit (HOSPITAL_COMMUNITY): Payer: 59 | Attending: Pulmonary Disease | Admitting: Occupational Therapy

## 2015-02-08 ENCOUNTER — Encounter (HOSPITAL_COMMUNITY): Payer: Self-pay | Admitting: Occupational Therapy

## 2015-02-08 DIAGNOSIS — M25612 Stiffness of left shoulder, not elsewhere classified: Secondary | ICD-10-CM

## 2015-02-08 DIAGNOSIS — M6281 Muscle weakness (generalized): Secondary | ICD-10-CM | POA: Insufficient documentation

## 2015-02-08 DIAGNOSIS — M25512 Pain in left shoulder: Secondary | ICD-10-CM | POA: Insufficient documentation

## 2015-02-08 DIAGNOSIS — M629 Disorder of muscle, unspecified: Secondary | ICD-10-CM | POA: Diagnosis present

## 2015-02-08 DIAGNOSIS — M6289 Other specified disorders of muscle: Secondary | ICD-10-CM

## 2015-02-08 NOTE — Therapy (Signed)
[facility] [address] [place], [place], [address] Phone: [phone]   Fax:  [phone]  Occupational Therapy Treatment  Patient Details  Name: Melissa Meyers MRN: [id number] Date of Birth: [birth year] Referring Provider:  Kari Baars, MD  Encounter Date: 02/08/2015      OT End of Session - 02/08/15 1624    Visit Number 2   Number of Visits 18   Date for OT Re-Evaluation 04/02/15  Mini-reassess 03/02/2015   Authorization Type [employer]   Authorization Time Period Visit limit is 23 for OT   Authorization - Visit Number 2   Authorization - Number of Visits 23   OT Start Time 1518   OT Stop Time 1615   OT Time Calculation (min) 57 min   Activity Tolerance Patient tolerated treatment well   Behavior During Therapy [facility] for tasks assessed/performed      Past Medical History  Diagnosis Date  . Acid reflux   . Sinus drainage   . Thyroid disease     Past Surgical History  Procedure Laterality Date  . Tubal ligation      There were no vitals filed for this visit.  Visit Diagnosis:  Pain in left shoulder  Tight fascia  Muscle left arm weakness  Stiffness of left shoulder joint      Subjective Assessment - 02/08/15 1520    Subjective  S: My shoulder is very sore today, about the same as it always is.    Currently in Pain? Yes   Pain Score 7    Pain Location Shoulder   Pain Orientation Left   Pain Descriptors / Indicators Aching   Pain Type Chronic pain            [facility] OT Assessment - 02/08/15 1520    Assessment   Diagnosis left rotator cuff syndrome   Precautions   Precautions None                  OT Treatments/Exercises (OP) - 02/08/15 1521    Exercises   Exercises Shoulder   Shoulder Exercises: Supine   Protraction PROM;10 reps;AAROM;5 reps   Horizontal ABduction PROM;10 reps   External Rotation PROM;10 reps;AAROM;5 reps   Internal Rotation PROM;10 reps;AAROM;5 reps   Flexion PROM;10 reps;AAROM;5 reps   ABduction PROM;10 reps   Modalities   Modalities Electrical Stimulation;Moist Heat   Moist Heat Therapy   Number Minutes Moist Heat 15 Minutes   Moist Heat Location Shoulder   Electrical Stimulation   Electrical Stimulation Location left shoulder   Electrical Stimulation Action inferential    Electrical Stimulation Parameters 8.2 CV   Electrical Stimulation Goals Pain   Manual Therapy   Manual Therapy Myofascial release   Myofascial Release Myofascial release to left upper arm, deltoid, trapezius, and scapularis regions to decrease pain and fascial restrictions and increase joint mobility.                   OT Short Term Goals - 02/08/15 1628    OT SHORT TERM GOAL #1   Title Pt will be educated on HEP.    Time 3   Period Weeks   Status On-going   OT SHORT TERM GOAL #2   Title Pt will decrease pain to 4/10 during daily tasks.    Time 3   Period Weeks   Status On-going   OT SHORT TERM GOAL #3   Title Pt will decrease fascial restrictions from max to  mod amounts.    Time 3   Period Weeks   Status On-going   OT SHORT TERM GOAL #4   Title Pt will increase AROM to [facility] to increase ability to donn shirts without using compensatory strategies.    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    Time 3   Period Weeks   Status On-going           OT Long Term Goals - 02/08/15 1628    OT LONG TERM GOAL #1   Title Pt will return to prior level of functioning and independence during daily activities.    Time 6   Period Weeks   Status On-going   OT LONG TERM GOAL #2   Title Pt will decrease fascial restrictions from mod to min amounts or less.    Time 6   Period Weeks   Status On-going   OT LONG TERM GOAL #3   Title Pt will decrease pain to 3/10 or less during daily tasks.    Time 6   Period Weeks    Status On-going   OT LONG TERM GOAL #4   Title Pt will increase range of motion to WNL to increase ability to reach into overhead cabinets.    Time 6   Period Weeks   Status On-going   OT LONG TERM GOAL #5   Title Pt will increase strength to 4+/5 to increase ability to carry weighted objects.    Time 6   Period Weeks   Status On-going               Plan - 02/08/15 1625    Clinical Impression Statement A: Initiated myofascial release, PROM, and AAROM this session. Pt is very tender during myofascial release, especially around trapezius region. Pt demonstrates increased pain during PROM and AAROM, verbal cuing required to only push to point of discomfort, not severe pain. IFES and moist heat applied at end of session for pain management.    Plan P: Complete remaining AAROM exercises in supine if able to tolerate (horizontal abduction, abduction), increase to 10 reps if tolerable. Provide and review evaluation.         Problem List There are no active problems to display for this patient.   Ezra Sites, OTR/L  [phone]  02/08/2015, 4:30 PM  [facility] [address] [place], [place], [address] Phone: [phone]   Fax:  [phone]

## 2015-02-13 ENCOUNTER — Ambulatory Visit (HOSPITAL_COMMUNITY): Payer: 59 | Admitting: Occupational Therapy

## 2015-02-13 ENCOUNTER — Encounter (HOSPITAL_COMMUNITY): Payer: Self-pay | Admitting: Occupational Therapy

## 2015-02-13 DIAGNOSIS — M6289 Other specified disorders of muscle: Secondary | ICD-10-CM

## 2015-02-13 DIAGNOSIS — M629 Disorder of muscle, unspecified: Secondary | ICD-10-CM

## 2015-02-13 DIAGNOSIS — M6281 Muscle weakness (generalized): Secondary | ICD-10-CM

## 2015-02-13 DIAGNOSIS — M25512 Pain in left shoulder: Secondary | ICD-10-CM | POA: Diagnosis not present

## 2015-02-13 DIAGNOSIS — M25612 Stiffness of left shoulder, not elsewhere classified: Secondary | ICD-10-CM

## 2015-02-13 NOTE — Therapy (Signed)
[place] [facility] [address] [place], [place], [address] Phone: [phone]   Fax:  [phone]  Occupational Therapy Treatment  Patient Details  Name: Melissa Meyers MRN: [id number] Date of Birth: [birth year] Referring Provider:  Kari Baars, MD  Encounter Date: 02/13/2015      OT End of Session - 02/13/15 1503    Visit Number 3   Number of Visits 18   Date for OT Re-Evaluation 04/02/15  Mini-reassess 03/02/2015   Authorization Type [employer]   Authorization Time Period Visit limit is 23 for OT   Authorization - Visit Number 3   Authorization - Number of Visits 23   OT Start Time 1410   OT Stop Time 1457   OT Time Calculation (min) 47 min   Activity Tolerance Patient tolerated treatment well   Behavior During Therapy [facility] for tasks assessed/performed      Past Medical History  Diagnosis Date  . Acid reflux   . Sinus drainage   . Thyroid disease     Past Surgical History  Procedure Laterality Date  . Tubal ligation      There were no vitals filed for this visit.  Visit Diagnosis:  Pain in left shoulder  Tight fascia  Muscle left arm weakness  Stiffness of left shoulder joint      Subjective Assessment - 02/13/15 1408    Subjective  S: It feels about the same as last time.    Currently in Pain? Yes   Pain Score 7    Pain Location Shoulder   Pain Orientation Left   Pain Descriptors / Indicators Aching   Pain Type Chronic pain            [facility] OT Assessment - 02/13/15 1408    Assessment   Diagnosis left rotator cuff syndrome   Precautions   Precautions None                  OT Treatments/Exercises (OP) - 02/13/15 1412    Exercises   Exercises Shoulder   Shoulder Exercises: Supine   Protraction PROM;AAROM;10 reps   Horizontal ABduction PROM;10 reps;AAROM;5 reps   External Rotation PROM;AAROM;10 reps   Internal Rotation PROM;AAROM;10 reps   Flexion PROM;AAROM;10 reps   ABduction  PROM;AAROM;10 reps   Shoulder Exercises: Seated   Elevation AROM;10 reps   Extension AROM;10 reps   Row AROM;10 reps   Shoulder Exercises: Therapy Ball   Flexion 10 reps   ABduction 10 reps   Manual Therapy   Manual Therapy Myofascial release;Muscle Energy Technique   Myofascial Release Myofascial release to left upper arm, deltoid, trapezius, and scapularis regions to decrease pain and fascial restrictions and increase joint mobility.    Muscle Energy Technique Muscle Energy Technique to left anterior deltoid to relax tone and muscle spasm to increase joint mobility.                   OT Short Term Goals - 02/08/15 1628    OT SHORT TERM GOAL #1   Title Pt will be educated on HEP.    Time 3   Period Weeks   Status On-going   OT SHORT TERM GOAL #2   Title Pt will decrease pain to 4/10 during daily tasks.    Time 3   Period Weeks   Status On-going   OT SHORT TERM GOAL #3   Title Pt will decrease fascial restrictions from max to mod amounts.  Time 3   Period Weeks   Status On-going   OT SHORT TERM GOAL #4   Title Pt will increase AROM to [facility] to increase ability to donn shirts without using compensatory strategies.    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    Time 3   Period Weeks   Status On-going           OT Long Term Goals - 02/08/15 1628    OT LONG TERM GOAL #1   Title Pt will return to prior level of functioning and independence during daily activities.    Time 6   Period Weeks   Status On-going   OT LONG TERM GOAL #2   Title Pt will decrease fascial restrictions from mod to min amounts or less.    Time 6   Period Weeks   Status On-going   OT LONG TERM GOAL #3   Title Pt will decrease pain to 3/10 or less during daily tasks.    Time 6   Period Weeks   Status On-going   OT LONG TERM GOAL #4   Title Pt will  increase range of motion to WNL to increase ability to reach into overhead cabinets.    Time 6   Period Weeks   Status On-going   OT LONG TERM GOAL #5   Title Pt will increase strength to 4+/5 to increase ability to carry weighted objects.    Time 6   Period Weeks   Status On-going               Plan - 02/13/15 1503    Clinical Impression Statement A: Added horizontal abduction and abduction in supine this session, pt completed 10 repetitions of each exercise with the exception of horizontal abduction. Added muscle energy technique, during which therapist noted slight relaxation of anterior deltoid. Pt continues to experience increased pain and tightness during exercises, report IFES did not help in previous session.    Plan P: Continue AAROM exercises. Attempt to complete 10 repetitions of all exercises, follow-up on pain/soreness from previous session. Provide and review evaluation.         Problem List There are no active problems to display for this patient.   Ezra Sites, OTR/L  [phone]  02/13/2015, 3:07 PM  [facility] [address] [place], [place], [address] Phone: [phone]   Fax:  [phone]

## 2015-02-15 ENCOUNTER — Encounter (HOSPITAL_COMMUNITY): Payer: Self-pay | Admitting: Occupational Therapy

## 2015-02-15 ENCOUNTER — Ambulatory Visit (HOSPITAL_COMMUNITY): Payer: 59 | Admitting: Occupational Therapy

## 2015-02-15 DIAGNOSIS — M629 Disorder of muscle, unspecified: Secondary | ICD-10-CM

## 2015-02-15 DIAGNOSIS — M6289 Other specified disorders of muscle: Secondary | ICD-10-CM

## 2015-02-15 DIAGNOSIS — M6281 Muscle weakness (generalized): Secondary | ICD-10-CM

## 2015-02-15 DIAGNOSIS — M25512 Pain in left shoulder: Secondary | ICD-10-CM | POA: Diagnosis not present

## 2015-02-15 DIAGNOSIS — M25612 Stiffness of left shoulder, not elsewhere classified: Secondary | ICD-10-CM

## 2015-02-15 NOTE — Therapy (Signed)
[facility] [address] [place], [place], [address] Phone: [phone]   Fax:  [phone]  Occupational Therapy Treatment  Patient Details  Name: Melissa Meyers MRN: [id number] Date of Birth: 28-Jan-1966 Referring Provider:  Kari Baars, MD  Encounter Date: 02/15/2015      OT End of Session - 02/15/15 1616    Visit Number 4   Number of Visits 18   Date for OT Re-Evaluation 04/02/15  Mini-reassess 03/02/2015   Authorization Type [employer]   Authorization Time Period Visit limit is 23 for OT   Authorization - Visit Number 4   Authorization - Number of Visits 23   OT Start Time 1515   OT Stop Time 1607   OT Time Calculation (min) 52 min   Activity Tolerance Patient tolerated treatment well   Behavior During Therapy [facility] for tasks assessed/performed      Past Medical History  Diagnosis Date  . Acid reflux   . Sinus drainage   . Thyroid disease     Past Surgical History  Procedure Laterality Date  . Tubal ligation      There were no vitals filed for this visit.  Visit Diagnosis:  Pain in left shoulder  Tight fascia  Muscle left arm weakness  Stiffness of left shoulder joint      Subjective Assessment - 02/15/15 1516    Subjective  S: After it calmed down there was no lasting soreness after last session.    Currently in Pain? Yes   Pain Score 7    Pain Location Shoulder   Pain Orientation Left   Pain Descriptors / Indicators Aching   Pain Type Acute pain            [facility] OT Assessment - 02/15/15 1516    Assessment   Diagnosis left rotator cuff syndrome   Precautions   Precautions None                  OT Treatments/Exercises (OP) - 02/15/15 1519    Exercises   Exercises Shoulder   Shoulder Exercises: Supine   Protraction PROM;AAROM;10 reps   Horizontal ABduction PROM;AAROM;10 reps   External Rotation PROM;AAROM;10 reps   Internal Rotation PROM;AAROM;10 reps   Flexion  PROM;AAROM;10 reps   ABduction PROM;AAROM;10 reps   Shoulder Exercises: Seated   Elevation AROM;10 reps   Extension AROM;10 reps   Row AROM;10 reps   Shoulder Exercises: Pulleys   Flexion 1 minute   ABduction 1 minute   Manual Therapy   Manual Therapy Myofascial release;Muscle Energy Technique   Myofascial Release Myofascial release to left upper arm, deltoid, trapezius, and scapularis regions to decrease pain and fascial restrictions and increase joint mobility.    Muscle Energy Technique Muscle Energy Technique to left anterior deltoid to relax tone and muscle spasm to increase joint mobility.                   OT Short Term Goals - 02/08/15 1628    OT SHORT TERM GOAL #1   Title Pt will be educated on HEP.    Time 3   Period Weeks   Status On-going   OT SHORT TERM GOAL #2   Title Pt will decrease pain to 4/10 during daily tasks.    Time 3   Period Weeks   Status On-going   OT SHORT TERM GOAL #3   Title Pt will decrease fascial restrictions from max to mod amounts.  Time 3   Period Weeks   Status On-going   OT SHORT TERM GOAL #4   Title Pt will increase AROM to [facility] to increase ability to donn shirts without using compensatory strategies.    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    Time 3   Period Weeks   Status On-going           OT Long Term Goals - 02/08/15 1628    OT LONG TERM GOAL #1   Title Pt will return to prior level of functioning and independence during daily activities.    Time 6   Period Weeks   Status On-going   OT LONG TERM GOAL #2   Title Pt will decrease fascial restrictions from mod to min amounts or less.    Time 6   Period Weeks   Status On-going   OT LONG TERM GOAL #3   Title Pt will decrease pain to 3/10 or less during daily tasks.    Time 6   Period Weeks   Status On-going   OT LONG TERM  GOAL #4   Title Pt will increase range of motion to WNL to increase ability to reach into overhead cabinets.    Time 6   Period Weeks   Status On-going   OT LONG TERM GOAL #5   Title Pt will increase strength to 4+/5 to increase ability to carry weighted objects.    Time 6   Period Weeks   Status On-going               Plan - 02/15/15 1616    Clinical Impression Statement A: Added pulleys, completed 10 repetitions of all AAROM exercises. Pt reports her shoulder is feeling the same, no change in pain or soreness. Pt required verbal cuing to relax arm during manual stretching.    Plan P: Resume therapy ball. Cont AAROM, add proximal shoulder strengthening if able to tolerate.         Problem List There are no active problems to display for this patient.   Ezra Sites, OTR/L  [phone]  02/15/2015, 4:18 PM  [facility] [address] [place], [place], [address] Phone: [phone]   Fax:  [phone]

## 2015-02-20 ENCOUNTER — Encounter (HOSPITAL_COMMUNITY): Payer: Self-pay | Admitting: Occupational Therapy

## 2015-02-20 ENCOUNTER — Ambulatory Visit (HOSPITAL_COMMUNITY): Payer: 59 | Admitting: Occupational Therapy

## 2015-02-20 DIAGNOSIS — M25512 Pain in left shoulder: Secondary | ICD-10-CM | POA: Diagnosis not present

## 2015-02-20 DIAGNOSIS — M6281 Muscle weakness (generalized): Secondary | ICD-10-CM

## 2015-02-20 DIAGNOSIS — M6289 Other specified disorders of muscle: Secondary | ICD-10-CM

## 2015-02-20 DIAGNOSIS — M25612 Stiffness of left shoulder, not elsewhere classified: Secondary | ICD-10-CM

## 2015-02-20 DIAGNOSIS — M629 Disorder of muscle, unspecified: Secondary | ICD-10-CM

## 2015-02-20 NOTE — Therapy (Signed)
[facility] [address] [place], [place], [address] Phone: [phone]   Fax:  [phone]  Occupational Therapy Treatment  Patient Details  Name: Melissa Meyers MRN: [id number] Date of Birth: [birth year] Referring Provider:  Vickki Hearing, MD  Encounter Date: 02/20/2015      OT End of Session - 02/20/15 1620    Visit Number 5   Number of Visits 18   Date for OT Re-Evaluation 04/02/15  Mini-reassess 03/02/2015   Authorization Type [employer]   Authorization Time Period Visit limit is 23 for OT   Authorization - Visit Number 5   Authorization - Number of Visits 23   OT Start Time 1520   OT Stop Time 1620   OT Time Calculation (min) 60 min   Activity Tolerance Patient tolerated treatment well   Behavior During Therapy [facility] for tasks assessed/performed      Past Medical History  Diagnosis Date  . Acid reflux   . Sinus drainage   . Thyroid disease     Past Surgical History  Procedure Laterality Date  . Tubal ligation      There were no vitals filed for this visit.  Visit Diagnosis:  Pain in left shoulder  Tight fascia  Muscle left arm weakness  Stiffness of left shoulder joint      Subjective Assessment - 02/20/15 1521    Subjective  S: It's feeling pretty much the same as usual.    Currently in Pain? Yes   Pain Score 7    Pain Location Shoulder   Pain Orientation Left   Pain Descriptors / Indicators Aching   Pain Type Acute pain            [facility] OT Assessment - 02/20/15 1618    Assessment   Diagnosis left rotator cuff syndrome   Precautions   Precautions None                  OT Treatments/Exercises (OP) - 02/20/15 1522    Exercises   Exercises Shoulder   Shoulder Exercises: Supine   Protraction PROM;AAROM;10 reps   Horizontal ABduction PROM;AAROM;10 reps   External Rotation PROM;AAROM;10 reps   Internal Rotation PROM;AAROM;10 reps   Flexion PROM;AAROM;10 reps   ABduction PROM;AAROM;10 reps   Shoulder Exercises: Seated   Elevation AROM;10 reps   Extension AROM;10 reps   Row AROM;10 reps   Shoulder Exercises: Pulleys   Flexion 1 minute   ABduction 1 minute   Modalities   Modalities Electrical Stimulation;Moist Heat   Moist Heat Therapy   Number Minutes Moist Heat 10 Minutes   Moist Heat Location Shoulder   Electrical Stimulation   Electrical Stimulation Location left shoulder   Electrical Stimulation Action interferential   Electrical Stimulation Parameters 7.4 CV   Electrical Stimulation Goals Pain   Manual Therapy   Manual Therapy Myofascial release;Muscle Energy Technique   Myofascial Release Myofascial release to left upper arm, deltoid, trapezius, and scapularis regions to decrease pain and fascial restrictions and increase joint mobility.    Muscle Energy Technique Muscle Energy Technique to left anterior deltoid to relax tone and muscle spasm to increase joint mobility.                   OT Short Term Goals - 02/08/15 1628    OT SHORT TERM GOAL #1   Title Pt will be educated on HEP.    Time 3   Period Weeks   Status  On-going   OT SHORT TERM GOAL #2   Title Pt will decrease pain to 4/10 during daily tasks.    Time 3   Period Weeks   Status On-going   OT SHORT TERM GOAL #3   Title Pt will decrease fascial restrictions from max to mod amounts.    Time 3   Period Weeks   Status On-going   OT SHORT TERM GOAL #4   Title Pt will increase AROM to [facility] to increase ability to donn shirts without using compensatory strategies.    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    Time 3   Period Weeks   Status On-going           OT Long Term Goals - 02/08/15 1628    OT LONG TERM GOAL #1   Title Pt will return to prior level of functioning and independence during daily activities.    Time 6    Period Weeks   Status On-going   OT LONG TERM GOAL #2   Title Pt will decrease fascial restrictions from mod to min amounts or less.    Time 6   Period Weeks   Status On-going   OT LONG TERM GOAL #3   Title Pt will decrease pain to 3/10 or less during daily tasks.    Time 6   Period Weeks   Status On-going   OT LONG TERM GOAL #4   Title Pt will increase range of motion to WNL to increase ability to reach into overhead cabinets.    Time 6   Period Weeks   Status On-going   OT LONG TERM GOAL #5   Title Pt will increase strength to 4+/5 to increase ability to carry weighted objects.    Time 6   Period Weeks   Status On-going               Plan - 02/20/15 1630    Clinical Impression Statement A: Continued AAROM exercises this session. Pt continues to be pain limited during PROM and AAROM exercises. Pt reports no change in pain level or shoulder function on arrival. OT noted decreased fascial restrictions along left upper arm. Focus of session on relaxing arm and decreasing guarding tendencies.    Plan P: Add AAROM in sitting, proximal shoulder strengthening in supine if able to tolerate. Relaxation techniques.         Problem List There are no active problems to display for this patient.   Ezra Sites, OTR/L  [phone]  02/20/2015, 4:33 PM  [facility] [address] [place], [place], [address] Phone: [phone]   Fax:  [phone]

## 2015-02-22 ENCOUNTER — Encounter (HOSPITAL_COMMUNITY): Payer: Self-pay

## 2015-02-22 ENCOUNTER — Ambulatory Visit (HOSPITAL_COMMUNITY): Payer: 59

## 2015-02-22 DIAGNOSIS — M629 Disorder of muscle, unspecified: Secondary | ICD-10-CM

## 2015-02-22 DIAGNOSIS — M6281 Muscle weakness (generalized): Secondary | ICD-10-CM

## 2015-02-22 DIAGNOSIS — M25512 Pain in left shoulder: Secondary | ICD-10-CM | POA: Diagnosis not present

## 2015-02-22 DIAGNOSIS — M25612 Stiffness of left shoulder, not elsewhere classified: Secondary | ICD-10-CM

## 2015-02-22 DIAGNOSIS — M6289 Other specified disorders of muscle: Secondary | ICD-10-CM

## 2015-02-22 NOTE — Therapy (Signed)
[facility] [address] [place], [place], [address] Phone: [phone]   Fax:  [phone]  Occupational Therapy Treatment  Patient Details  Name: Melissa Meyers MRN: [id number] Date of Birth: 08-Jul-1965 Referring Provider:  Kari Baars, MD  Encounter Date: 02/22/2015      OT End of Session - 02/22/15 1701    Visit Number 6   Number of Visits 18   Date for OT Re-Evaluation 04/02/15  Mini-reassess 03/02/2015   Authorization Type [employer]   Authorization Time Period Visit limit is 23 for OT   Authorization - Visit Number 6   Authorization - Number of Visits 23   OT Start Time 1520   OT Stop Time 1605   OT Time Calculation (min) 45 min   Activity Tolerance Patient tolerated treatment well   Behavior During Therapy [facility] for tasks assessed/performed      Past Medical History  Diagnosis Date  . Acid reflux   . Sinus drainage   . Thyroid disease     Past Surgical History  Procedure Laterality Date  . Tubal ligation      There were no vitals filed for this visit.  Visit Diagnosis:  Pain in left shoulder  Tight fascia  Muscle left arm weakness  Stiffness of left shoulder joint      Subjective Assessment - 02/22/15 1658    Subjective  S: It's feeling pretty much the same as usual.    Currently in Pain? Yes   Pain Score 7    Pain Location Shoulder   Pain Orientation Left   Pain Descriptors / Indicators Aching   Pain Type Acute pain                      OT Treatments/Exercises (OP) - 02/22/15 1558    Exercises   Exercises Neck;Shoulder   Neck Exercises: Stretches   Upper Trapezius Stretch 2 reps;10 seconds   Shoulder Exercises: Supine   Protraction PROM;AAROM;10 reps   Horizontal ABduction PROM;10 reps   External Rotation PROM;10 reps   Internal Rotation PROM;10 reps   Flexion PROM;10 reps   ABduction PROM;10 reps   Shoulder Exercises: Seated   Retraction AROM;10 reps   Shoulder  Exercises: Stretch   Other Shoulder Stretches Posterior capsule stretch; 2 reps; 10 seconds   Manual Therapy   Manual Therapy Myofascial release;Muscle Energy Technique;Manual Traction   Manual therapy comments 1st rib manipulation completed to left side.    Myofascial Release Myofascial release to left upper arm, deltoid, trapezius, and scapularis regions to decrease pain and fascial restrictions and increase joint mobility.    Manual Traction Gentle manual traction applied to left UE.    Muscle Energy Technique Muscle Energy Technique to left anterior deltoid to relax tone and muscle spasm to increase joint mobility.                   OT Short Term Goals - 02/08/15 1628    OT SHORT TERM GOAL #1   Title Pt will be educated on HEP.    Time 3   Period Weeks   Status On-going   OT SHORT TERM GOAL #2   Title Pt will decrease pain to 4/10 during daily tasks.    Time 3   Period Weeks   Status On-going   OT SHORT TERM GOAL #3   Title Pt will decrease fascial restrictions from max to mod amounts.    Time 3  Period Weeks   Status On-going   OT SHORT TERM GOAL #4   Title Pt will increase AROM to [facility] to increase ability to donn shirts without using compensatory strategies.    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    Time 3   Period Weeks   Status On-going           OT Long Term Goals - 02/08/15 1628    OT LONG TERM GOAL #1   Title Pt will return to prior level of functioning and independence during daily activities.    Time 6   Period Weeks   Status On-going   OT LONG TERM GOAL #2   Title Pt will decrease fascial restrictions from mod to min amounts or less.    Time 6   Period Weeks   Status On-going   OT LONG TERM GOAL #3   Title Pt will decrease pain to 3/10 or less during daily tasks.    Time 6   Period Weeks   Status On-going    OT LONG TERM GOAL #4   Title Pt will increase range of motion to WNL to increase ability to reach into overhead cabinets.    Time 6   Period Weeks   Status On-going   OT LONG TERM GOAL #5   Title Pt will increase strength to 4+/5 to increase ability to carry weighted objects.    Time 6   Period Weeks   Status On-going               Plan - 02/22/15 1701    Clinical Impression Statement A: Focused session on pain management. Patient presents with increased edema in LUE starting at elbow and working up towards shoulder. Max fascial restrictions in upper arm to upper trapezius. Discussed with patient the possible causes of pain and what may be going on to cause her so much. Pt reports burning behind her left scapula, numbness and tingling in her left hand and extreme pian with movement of her shoulder.    Plan P: Add AA/ROM seated, proximal shoulder strengthening supine. update HEP. Add cervical traction.         Problem List There are no active problems to display for this patient.   Limmie Patricia, OTR/L,CBIS  [phone]  02/22/2015, 5:07 PM  [place] [facility] [address] [place], [place], [address] Phone: [phone]   Fax:  [phone]

## 2015-02-27 ENCOUNTER — Encounter (HOSPITAL_COMMUNITY): Payer: Self-pay | Admitting: Occupational Therapy

## 2015-02-27 ENCOUNTER — Ambulatory Visit (HOSPITAL_COMMUNITY): Payer: 59 | Admitting: Occupational Therapy

## 2015-02-27 ENCOUNTER — Encounter (HOSPITAL_COMMUNITY): Payer: 59

## 2015-02-27 DIAGNOSIS — M6281 Muscle weakness (generalized): Secondary | ICD-10-CM

## 2015-02-27 DIAGNOSIS — M629 Disorder of muscle, unspecified: Secondary | ICD-10-CM

## 2015-02-27 DIAGNOSIS — M6289 Other specified disorders of muscle: Secondary | ICD-10-CM

## 2015-02-27 DIAGNOSIS — M25512 Pain in left shoulder: Secondary | ICD-10-CM

## 2015-02-27 DIAGNOSIS — M25612 Stiffness of left shoulder, not elsewhere classified: Secondary | ICD-10-CM

## 2015-02-27 NOTE — Therapy (Signed)
[facility] [address] [place], [place], [address] Phone: [phone]   Fax:  [phone]  Occupational Therapy Treatment  Patient Details  Name: Melissa Meyers MRN: [id number] Date of Birth: 08-28-1965 Referring Provider:  Kari Baars, MD  Encounter Date: 02/27/2015      OT End of Session - 02/27/15 1532    Visit Number 7   Number of Visits 18   Date for OT Re-Evaluation 04/02/15  mini reassess 9/23   Authorization Type [employer]   Authorization Time Period Visit limit is 23 for OT   Authorization - Visit Number 7   Authorization - Number of Visits 23   OT Start Time 1345   OT Stop Time 1430   OT Time Calculation (min) 45 min   Activity Tolerance Patient tolerated treatment well   Behavior During Therapy [facility] for tasks assessed/performed      Past Medical History  Diagnosis Date  . Acid reflux   . Sinus drainage   . Thyroid disease     Past Surgical History  Procedure Laterality Date  . Tubal ligation      There were no vitals filed for this visit.  Visit Diagnosis:  Pain in left shoulder  Tight fascia  Muscle left arm weakness  Stiffness of left shoulder joint      Subjective Assessment - 02/27/15 1347    Subjective  S: It's about the same, no change really.    Currently in Pain? Yes   Pain Score 7    Pain Location Shoulder   Pain Orientation Left   Pain Descriptors / Indicators Aching   Pain Type Acute pain            [facility] OT Assessment - 02/27/15 1527    Assessment   Diagnosis left rotator cuff syndrome   Precautions   Precautions None                  OT Treatments/Exercises (OP) - 02/27/15 1348    Exercises   Exercises Neck;Shoulder   Shoulder Exercises: Supine   Protraction PROM;10 reps   Horizontal ABduction PROM;10 reps   External Rotation PROM;10 reps   Internal Rotation PROM;10 reps   Flexion PROM;10 reps   ABduction PROM;10 reps   Shoulder Exercises:  Standing   Protraction AAROM;10 reps   Horizontal ABduction AAROM;10 reps   External Rotation AAROM;10 reps   Internal Rotation AAROM;10 reps   Flexion AAROM;10 reps   ABduction AAROM;10 reps   Manual Therapy   Manual Therapy Myofascial release;Muscle Energy Technique;Manual Traction   Myofascial Release Myofascial release to left upper arm, deltoid, trapezius, and scapularis regions to decrease pain and fascial restrictions and increase joint mobility. Myofascial rebounding in prone to relax tone and increase LUE mobility; 2 min total.    Muscle Energy Technique Muscle Energy Technique to left anterior deltoid to relax tone and muscle spasm to increase joint mobility.                 OT Education - 02/27/15 1532    Education provided Yes   Education Details AAROM exercises    Person(s) Educated Patient   Methods Explanation;Demonstration;Handout   Comprehension Verbalized understanding;Returned demonstration          OT Short Term Goals - 02/08/15 1628    OT SHORT TERM GOAL #1   Title Pt will be educated on HEP.    Time 3   Period Weeks   Status  On-going   OT SHORT TERM GOAL #2   Title Pt will decrease pain to 4/10 during daily tasks.    Time 3   Period Weeks   Status On-going   OT SHORT TERM GOAL #3   Title Pt will decrease fascial restrictions from max to mod amounts.    Time 3   Period Weeks   Status On-going   OT SHORT TERM GOAL #4   Title Pt will increase AROM to [employer] to increase ability to donn shirts without using compensatory strategies.    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    Time 3   Period Weeks   Status On-going           OT Long Term Goals - 02/08/15 1628    OT LONG TERM GOAL #1   Title Pt will return to prior level of functioning and independence during daily activities.    Time 6   Period Weeks    Status On-going   OT LONG TERM GOAL #2   Title Pt will decrease fascial restrictions from mod to min amounts or less.    Time 6   Period Weeks   Status On-going   OT LONG TERM GOAL #3   Title Pt will decrease pain to 3/10 or less during daily tasks.    Time 6   Period Weeks   Status On-going   OT LONG TERM GOAL #4   Title Pt will increase range of motion to WNL to increase ability to reach into overhead cabinets.    Time 6   Period Weeks   Status On-going   OT LONG TERM GOAL #5   Title Pt will increase strength to 4+/5 to increase ability to carry weighted objects.    Time 6   Period Weeks   Status On-going               Plan - 02/27/15 1533    Clinical Impression Statement A: Myofascial rebounding technique added this session to relax tone and increase LUE range of motion. Pt continues to report no change in pain, and experiences max fascial restrictions in upper arm to trapezius. Added seated AAROM exercises, no reports of increased pain. HEP updated for AAROM in supine & standing.    Plan P: Follow up on HEP. Add cervical traction. Mini-reassessment        Problem List There are no active problems to display for this patient.   Ezra Sites, OTR/L  [phone]  02/27/2015, 3:36 PM  [facility] [address] [place], [place], [address] Phone: [phone]   Fax:  [phone]

## 2015-02-27 NOTE — Patient Instructions (Signed)
Perform each exercise ___10_____ reps. 2x day.   Protraction - STANDING  Start by holding a wand or cane at chest height.  Next, slowly push the wand outwards in front of your body so that your elbows become fully straightened. Then, return to the original position.     Shoulder FLEXION - STANDING - PALMS UP  In the standing position, hold a wand/cane with both arms, palms up on both sides. Raise up the wand/cane allowing your unaffected arm to perform most of the effort. Your affected arm should be partially relaxed.      Internal/External ROTATION - STANDING  In the standing position, hold a wand/cane with both hands keeping your elbows bent. Move your arms and wand/cane to one side.  Your affected arm should be partially relaxed while your unaffected arm performs most of the effort.       Shoulder ABDUCTION - STANDING  While holding a wand/cane palm face up on the injured side and palm face down on the uninjured side, slowly raise up your injured arm to the side.                     Horizontal Abduction/Adduction   Straight arms holding cane at shoulder height, bring cane to right, center, left. Repeat starting to left.   Copyright  [employer]. All rights reserved.

## 2015-03-01 ENCOUNTER — Ambulatory Visit (HOSPITAL_COMMUNITY): Payer: 59 | Admitting: Occupational Therapy

## 2015-03-01 ENCOUNTER — Encounter (HOSPITAL_COMMUNITY): Payer: Self-pay | Admitting: Occupational Therapy

## 2015-03-01 DIAGNOSIS — M629 Disorder of muscle, unspecified: Secondary | ICD-10-CM

## 2015-03-01 DIAGNOSIS — M6289 Other specified disorders of muscle: Secondary | ICD-10-CM

## 2015-03-01 DIAGNOSIS — M25512 Pain in left shoulder: Secondary | ICD-10-CM

## 2015-03-01 DIAGNOSIS — M25612 Stiffness of left shoulder, not elsewhere classified: Secondary | ICD-10-CM

## 2015-03-01 DIAGNOSIS — M6281 Muscle weakness (generalized): Secondary | ICD-10-CM

## 2015-03-01 NOTE — Therapy (Addendum)
[place] [address] [place], [place], [address] Phone: [phone]   Fax:  [phone]  Occupational Therapy Reassessment and Treatment  Patient Details  Name: Melissa Meyers MRN: [id number] Date of Birth: May 10, 1966 Referring Provider:  Sinda Du, MD  Encounter Date: 03/01/2015      OT End of Session - 03/01/15 1555    Visit Number 8   Number of Visits 18   Date for OT Re-Evaluation 04/02/15   Authorization Type [place] Time Period Visit limit is 23 for OT   Authorization - Visit Number 8   Authorization - Number of Visits 23   OT Start Time 9476   OT Stop Time 1550   OT Time Calculation (min) 32 min   Activity Tolerance Patient tolerated treatment well   Behavior During Therapy [facility] for tasks assessed/performed      Past Medical History  Diagnosis Date  . Acid reflux   . Sinus drainage   . Thyroid disease     Past Surgical History  Procedure Laterality Date  . Tubal ligation      There were no vitals filed for this visit.  Visit Diagnosis:  Pain in left shoulder  Tight fascia  Muscle left arm weakness  Stiffness of left shoulder joint      Subjective Assessment - 03/01/15 1523    Subjective  S: There's still no change, still painful.    Currently in Pain? Yes   Pain Score 7    Pain Location Shoulder   Pain Orientation Left   Pain Descriptors / Indicators Aching   Pain Type Acute pain            [facility] OT Assessment - 03/01/15 1540    Assessment   Diagnosis left rotator cuff syndrome   Precautions   Precautions None   Palpation   Palpation comment Max fascial restrictions in left upper arm, deltoid, trapezius, and scapularis regions.    AROM   Overall AROM Comments Assessed in supine, ER/IR adducted   AROM Assessment Site Shoulder   Right/Left Shoulder Left   Left Shoulder Flexion 120 Degrees  previous 108   Left Shoulder ABduction 92 Degrees  previous 86    Left Shoulder Internal Rotation 90 Degrees  previous 80   Left Shoulder External Rotation 45 Degrees  previous 30   PROM   Overall PROM Comments Assessed in supine, ER/IR adducted   PROM Assessment Site Shoulder   Right/Left Shoulder Left   Left Shoulder Flexion 150 Degrees  previous 124   Left Shoulder ABduction 111 Degrees  previous 98   Left Shoulder Internal Rotation 90 Degrees  previous 88   Left Shoulder External Rotation 78 Degrees  previous 42   Strength   Overall Strength Comments Assessed in sitting, ER/IR adducted   Strength Assessment Site Shoulder   Right/Left Shoulder Left   Left Shoulder Flexion 3/5  previously unassessed   Left Shoulder ABduction 3/5  previously unassessed   Left Shoulder Internal Rotation 3/5  previously unassessed   Left Shoulder External Rotation 3/5  previously unassessed                  OT Treatments/Exercises (OP) - 03/01/15 1524    Exercises   Exercises Neck;Shoulder   Shoulder Exercises: Supine   Protraction PROM;10 reps   Horizontal ABduction PROM;10 reps   External Rotation PROM;10 reps   Internal Rotation PROM;10 reps   Flexion PROM;10 reps  ABduction PROM;10 reps   Manual Therapy   Manual Therapy Myofascial release;Muscle Energy Technique;Manual Traction   Myofascial Release Myofascial release to left upper arm, deltoid, trapezius, and scapularis regions to decrease pain and fascial restrictions and increase joint mobility. Myofascial rebounding in prone to relax tone and increase LUE mobility; 2 min total.    Muscle Energy Technique Muscle Energy Technique to left anterior deltoid to relax tone and muscle spasm to increase joint mobility.                   OT Short Term Goals - 02/08/15 1628    OT SHORT TERM GOAL #1   Title Pt will be educated on HEP.    Time 3   Period Weeks   Status On-going   OT SHORT TERM GOAL #2   Title Pt will decrease pain to 4/10 during daily tasks.    Time 3    Period Weeks   Status On-going   OT SHORT TERM GOAL #3   Title Pt will decrease fascial restrictions from max to mod amounts.    Time 3   Period Weeks   Status On-going   OT SHORT TERM GOAL #4   Title Pt will increase AROM to [facility] to increase ability to donn shirts without using compensatory strategies.    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    Time 3   Period Weeks   Status On-going           OT Long Term Goals - 02/08/15 1628    OT LONG TERM GOAL #1   Title Pt will return to prior level of functioning and independence during daily activities.    Time 6   Period Weeks   Status On-going   OT LONG TERM GOAL #2   Title Pt will decrease fascial restrictions from mod to min amounts or less.    Time 6   Period Weeks   Status On-going   OT LONG TERM GOAL #3   Title Pt will decrease pain to 3/10 or less during daily tasks.    Time 6   Period Weeks   Status On-going   OT LONG TERM GOAL #4   Title Pt will increase range of motion to WNL to increase ability to reach into overhead cabinets.    Time 6   Period Weeks   Status On-going   OT LONG TERM GOAL #5   Title Pt will increase strength to 4+/5 to increase ability to carry weighted objects.    Time 6   Period Weeks   Status On-going               Plan - 03/01/15 1555    Clinical Impression Statement A: Mini reassessment completed this session. Pt has not met any therapy goals thus far, some progress has been made regarding increased range of motion and strength. Pt continues to experience pain at 7/10 or greater with no relief during or outside of therapy. Pt is very guarded during exercises, increased pain experienced this session during PROM exercises. Discussed options with pt, deciding to hold therapy until she is able to speak with MD to determine if she should continue therapy or  proceed with alternative testing/options.    Plan P: Hold therapy until further instructions from MD.  Problem List There are no active problems to display for this patient.   Guadelupe Sabin, OTR/L  [phone]  03/01/2015, 4:01 PM  [place], [place], [address] Phone: [phone]   Fax:  [phone]   OCCUPATIONAL THERAPY DISCHARGE SUMMARY  Visits from Start of Care: 8  Current functional level related to goals / functional outcomes: OT LONG TERM GOAL #1    Title Pt will return to prior level of functioning and independence during daily activities.    Time 6   Period Weeks   Status On-going   OT LONG TERM GOAL #2   Title Pt will decrease fascial restrictions from mod to min amounts or less.    Time 6   Period Weeks   Status On-going   OT LONG TERM GOAL #3   Title Pt will decrease pain to 3/10 or less during daily tasks.    Time 6   Period Weeks   Status On-going   OT LONG TERM GOAL #4   Title Pt will increase range of motion to WNL to increase ability to reach into overhead cabinets.    Time 6   Period Weeks   Status On-going   OT LONG TERM GOAL #5   Title Pt will increase strength to 4+/5 to increase ability to carry weighted objects.    Time 6   Period Weeks   Status On-going          Remaining deficits: Pt continues to experience pain at 7/10 or greater with no relief during or outside of therapy. Pt is very guarded during exercises, increased pain experienced this session during PROM exercises. Discussed options with pt, deciding to hold therapy until she is able to speak with MD to determine if she should continue therapy or proceed with alternative testing/options. OT is discharging patient today as a new order was placed and patient is being picked up for PT caseload.   Education / Equipment:  AA/ROM  exercises, cervical stretches and ROM Plan: Patient agrees to discharge.  Patient goals were not met. Patient is being discharged due to lack of progress.  ?????        Ailene Ravel, OTR/L,CBIS  [phone]

## 2015-03-13 ENCOUNTER — Ambulatory Visit (INDEPENDENT_AMBULATORY_CARE_PROVIDER_SITE_OTHER): Payer: 59

## 2015-03-13 ENCOUNTER — Ambulatory Visit (INDEPENDENT_AMBULATORY_CARE_PROVIDER_SITE_OTHER): Payer: 59 | Admitting: Orthopedic Surgery

## 2015-03-13 VITALS — BP 129/74 | Ht 64.0 in | Wt 151.0 lb

## 2015-03-13 DIAGNOSIS — M25512 Pain in left shoulder: Secondary | ICD-10-CM

## 2015-03-13 DIAGNOSIS — M47812 Spondylosis without myelopathy or radiculopathy, cervical region: Secondary | ICD-10-CM

## 2015-03-13 NOTE — Patient Instructions (Addendum)
We will advise your PCP of need for re eval with Dr Jeral Fruit  Call [facility] therapy dept to restart therapy, new order will be sent for neck

## 2015-03-13 NOTE — Progress Notes (Signed)
Patient ID: Melissa Meyers, female   DOB: [birth year], 49 y.o.   MRN: [id number]  Follow up visit  Chief Complaint  Patient presents with  . Follow-up    follow up left shoulder     BP 129/74 mmHg  Ht  (1.626 m)  Wt 151 lb (68.493 kg)  BMI 25.91 kg/m2  Encounter Diagnoses  Name Primary?  . Left shoulder pain Yes  . Cervical spondylosis without myelopathy     49 year old female we sent to physical therapy for rotator cuff syndrome in the setting of degenerative disc disease and cervical spondylosis. She underwent therapy complained of periscapular pain left periscapular region but no motion deficits no strength deficits were noted. She did have some anterior rotator cuff interval tenderness and pain  She presents now for repeat evaluation  Review of systems her left arm is numb and tingling is noted when she moves her arm.  Examination shows her rotator cuff interval tenderness. Otherwise normal range of motion and strength in both upper extremities. Reflexes are 2+ and equal and symmetric.  X-rays today show hooked acromion no arthritis in the shoulder mild proximal migration of the humerus suggesting chronic rotator cuff disease  She does not have rotator cuff tear.  Recommend she see her neurosurgeon again for repeat evaluation for treatment regarding the cervical spondylosis and numbness of the left upper extremity  Follow-up here as needed  Shoulder synovitis should resolve with over-the-counter NSAIDs ice and appropriate rest.

## 2015-03-13 NOTE — Addendum Note (Signed)
Addended by: Vickki Hearing on: 03/13/2015 08:51 AM   Modules accepted: Kipp Brood

## 2015-03-20 ENCOUNTER — Ambulatory Visit (HOSPITAL_COMMUNITY): Payer: 59 | Attending: Pulmonary Disease

## 2015-03-20 DIAGNOSIS — M6281 Muscle weakness (generalized): Secondary | ICD-10-CM | POA: Insufficient documentation

## 2015-03-20 DIAGNOSIS — M47812 Spondylosis without myelopathy or radiculopathy, cervical region: Secondary | ICD-10-CM | POA: Insufficient documentation

## 2015-03-20 DIAGNOSIS — M7582 Other shoulder lesions, left shoulder: Secondary | ICD-10-CM | POA: Diagnosis present

## 2015-03-20 DIAGNOSIS — M542 Cervicalgia: Secondary | ICD-10-CM | POA: Diagnosis present

## 2015-03-20 DIAGNOSIS — R200 Anesthesia of skin: Secondary | ICD-10-CM | POA: Insufficient documentation

## 2015-03-20 DIAGNOSIS — M629 Disorder of muscle, unspecified: Secondary | ICD-10-CM | POA: Insufficient documentation

## 2015-03-20 DIAGNOSIS — M25512 Pain in left shoulder: Secondary | ICD-10-CM | POA: Insufficient documentation

## 2015-03-20 DIAGNOSIS — M436 Torticollis: Secondary | ICD-10-CM | POA: Insufficient documentation

## 2015-03-20 DIAGNOSIS — M25612 Stiffness of left shoulder, not elsewhere classified: Secondary | ICD-10-CM | POA: Insufficient documentation

## 2015-03-20 DIAGNOSIS — M75102 Unspecified rotator cuff tear or rupture of left shoulder, not specified as traumatic: Secondary | ICD-10-CM | POA: Diagnosis present

## 2015-03-20 DIAGNOSIS — G8929 Other chronic pain: Secondary | ICD-10-CM | POA: Diagnosis present

## 2015-03-20 DIAGNOSIS — M898X1 Other specified disorders of bone, shoulder: Secondary | ICD-10-CM | POA: Diagnosis present

## 2015-03-20 DIAGNOSIS — R202 Paresthesia of skin: Secondary | ICD-10-CM

## 2015-03-21 NOTE — Patient Instructions (Signed)
Taught self traction on towel roll for home. Encouraged gentle AROM in cervical rotation in this position.

## 2015-03-21 NOTE — Therapy (Signed)
[facility] [address] [place], [place], [address] Phone: [phone]   Fax:  [phone]  Physical Therapy Evaluation  Patient Details  Name: Melissa Meyers MRN: [id number] Date of Birth: 1965-12-24 Referring Provider:  Vickki Hearing, MD  Encounter Date: 03/20/2015      PT End of Session - 03/21/15 1523    Visit Number 1   Number of Visits 16   Date for PT Re-Evaluation 04/17/15   Authorization Type UHC   Authorization Time Period 03/20/15-05/20/15   Authorization - Visit Number 1   Authorization - Number of Visits 23   PT Start Time 1101   PT Stop Time 1153   PT Time Calculation (min) 52 min      Past Medical History  Diagnosis Date  . Acid reflux   . Sinus drainage   . Thyroid disease     Past Surgical History  Procedure Laterality Date  . Tubal ligation      There were no vitals filed for this visit.  Visit Diagnosis:  Cervical spondylosis without myelopathy - Plan: PT plan of care cert/re-cert  Chronic neck pain - Plan: PT plan of care cert/re-cert  Numbness and tingling in left upper extremity - Plan: PT plan of care cert/re-cert  Chronic scapular pain - Plan: PT plan of care cert/re-cert  Neck stiffness - Plan: PT plan of care cert/re-cert      Subjective Assessment - 03/21/15 1509    Subjective Pt is a 49yo white female who, 2 years ago, began to experience some L sided shoulder numbness/tingling that radiated into the hand. Pt initially saught treatemnt from OT. Additional followup from referring provider included imaging of cervical spine, which is thought to be contributing greatly to CC. Pt is now referred for cervical radiculopathy on L side.    Pertinent History Pt reports began two months ago. 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    How long can you sit comfortably? no problem related to this CC.    How long  can you stand comfortably? about 20 minutes with other limiting factors.    Diagnostic tests Imaging if CSpine reveals bony arhtirtic changes and bone spurs, per pt report.    Patient Stated Goals decrease pain, return to active lifestyle and running.    Currently in Pain? Yes   Pain Location Neck   Pain Orientation Left  near bulk of upper trap, palpable knot felt.    Pain Descriptors / Indicators Burning;Pins and needles;Tingling;Numbness   Pain Type Chronic pain   Pain Radiating Towards Total Left UE from shoulder to hand, both dorsal and volar.    Pain Onset Other (comment)  2 years ago.    Pain Frequency --  with all LUE movement and most cervical rotation.    Aggravating Factors  cervical rotation bilat, all sleeping positions, most UE functional use. resisted isometric triceps and biceps on L.    Effect of Pain on Daily Activities Very self limting.    Multiple Pain Sites Yes   Pain Location --  Sharp burning pain deneath the scapula with abduction on L.             [facility] PT Assessment - 03/21/15 0001    Assessment   Medical Diagnosis Cervical Spondylosis with L sided Radiculopathy   Hand Dominance Right  R for fine motor; L for heavy lifting, carrying.  Precautions   Precautions None   Prior Function   Level of Independence Independent   Vocation Student  studying criminal justice   Leisure reading, running, dancing        Posture: Pt presenting with very erect posture, scapular retraction, lower thoracic lordosis, and sharp s-curve transition between C6-T3.  Pt does not voluntarily move head during gait, transfers, or conversation.  Strength: MMT screening of strength in BUE reveals 5/5 strength throughout with exception to the following: L shoulder elevation (UT): 4+/5 Left shoulder Abduction: 4+/5, mildly painful Left elbow flexion: 4+/5, aggravation of subscapular burning pain Left elbow extension: 4+/5, aggravation of subscapular burning pain Left wrist  extension: 4/5, painful Left thumb extension: 3/5 L digital abduction: 3/5   ROM, Joint Mobility: Upon attempted toe touch, apparent loss of all spinal curvature from sacrum to T5, painful and slow.  Cervical flexion:  73 degrees Cervical extension: 23 degrees R cervical side-bending: 25 degrees, pulling sensation in L upper trap L cervical side-bending: 22 degrees, increased LUE numbness/tingling (total limb) R cervical rotation: 70 degrees, pain in LUE L cervical rotation: 40 degrees, pain in LUE   Sensation: Appears intact throughout. Coordination: Appears intact throughout. Tone, Spasticity: Not tested. Functional Mobility: Difficulty with sleeping positions, head turns while driving, ability to use LUE for ADL, IADL.  Special Tests: Spurling's Compression Test I/II: both positive; manual distraction x 60 seconds: relief of LUE symptoms.  The patient is positive in 3 of 4 special tests to meet the statistically validated clinical prediction rule for Cervical Spine Radiculopathy which include 1. Spurling's compression, 2. Cervical distraction, & 3. Cervical rotation on the affected side of less than 60 degrees. The fourth test (upper limb neural tension test) was not attempted. 3 of 4 in this CPR reveal a a likelihood ration of 6.1.                     PT Education - 03/21/15 1522    Education provided Yes   Education Details Start with daily traction in hooklying, especailly when pain is at its worst.    Person(s) Educated Patient   Methods Explanation;Demonstration   Comprehension Verbalized understanding;Returned demonstration          PT Short Term Goals - 03/21/15 1532    PT SHORT TERM GOAL #1   Title By 2nd week pt will demonstrate independence in starter HEP reporting good compliance at home without exacerbation of symptoms.    PT SHORT TERM GOAL #2   Title By end of thrid week, pt wil be able to decribe three ways to manage flare-up of symptoms at home to  improve sself efficacy in pain management.    PT SHORT TERM GOAL #3   Title By week 4, pt will improve left cervical rotation to >55* without exacerbation of symptoms to improve functional use of haed turns in ADL, IADL.    PT SHORT TERM GOAL #4   Title By week four pt will improve bilat cervical sidebendign to ~35 degrees without exacerbation of symptoms to improve tolerance to multiple sleeping postiions overnight.             PT Long Term Goals - 03/21/15 1536    PT LONG TERM GOAL #1   Title By 8th week pt will demonstrate independence in advanced HEP reporting good compliance at home without exacerbation of symptoms to drive continued progress after DC.    PT LONG TERM GOAL #2   Title By week  8, pt will improve left cervical rotation to >65* without exacerbation of symptoms to improve functional use of haed turns in ADL, IADL.    PT LONG TERM GOAL #3   Title By week four pt will improve bilat cervical sidebending to ~40 degrees without exacerbation of symptoms to improve tolerance to multiple sleeping postiions overnight.                 Plan - 03/21/15 1525    Clinical Impression Statement Pt presenting with pain with cervical rotation, and specific LUE movements. Pt demonstrating impaired posture, LUE strength, activity tolerance, poor tolerance to ADL and IADL, and limited cervical ROM. Pt will benefit from skilled PT intervention to address these impairments and return to PLOF in ADL, IADL, and leisure activity.    Pt will benefit from skilled therapeutic intervention in order to improve on the following deficits Decreased activity tolerance;Decreased range of motion;Decreased mobility;Decreased strength;Impaired flexibility;Improper body mechanics;Postural dysfunction;Pain   Rehab Potential Good   Clinical Impairments Affecting Rehab Potential Chronicity of current complaint.    PT Frequency 2x / week   PT Duration 4 weeks   PT Treatment/Interventions ADLs/Self Care Home  Management;Manual techniques;Therapeutic activities;Functional mobility training;Therapeutic exercise;Passive range of motion;Moist Heat;Traction;Patient/family education   PT Next Visit Plan MFR to L Upper Traps, Bilat scalenes, chin tucks in supine, manual cervical traction, L pec minor release, T-spine vectors for AROM.    PT Home Exercise Plan issued, continue to add as tolerated.          Problem List There are no active problems to display for this patient.   Melissa Meyers 03/21/2015, 9:47 PM 9:47 PM  Melissa Meyers, PT, DPT [place] License # [id number]      [facility] [address] [place], [place], [address] Phone: [phone]   Fax:  [phone]

## 2015-03-22 ENCOUNTER — Ambulatory Visit (HOSPITAL_COMMUNITY): Payer: 59 | Admitting: Physical Therapy

## 2015-03-22 DIAGNOSIS — R200 Anesthesia of skin: Secondary | ICD-10-CM

## 2015-03-22 DIAGNOSIS — M47812 Spondylosis without myelopathy or radiculopathy, cervical region: Secondary | ICD-10-CM | POA: Diagnosis not present

## 2015-03-22 DIAGNOSIS — M629 Disorder of muscle, unspecified: Secondary | ICD-10-CM

## 2015-03-22 DIAGNOSIS — M436 Torticollis: Secondary | ICD-10-CM

## 2015-03-22 DIAGNOSIS — M6289 Other specified disorders of muscle: Secondary | ICD-10-CM

## 2015-03-22 DIAGNOSIS — R202 Paresthesia of skin: Secondary | ICD-10-CM

## 2015-03-22 DIAGNOSIS — M898X1 Other specified disorders of bone, shoulder: Secondary | ICD-10-CM

## 2015-03-22 DIAGNOSIS — M25512 Pain in left shoulder: Secondary | ICD-10-CM

## 2015-03-22 DIAGNOSIS — M542 Cervicalgia: Secondary | ICD-10-CM

## 2015-03-22 DIAGNOSIS — M6281 Muscle weakness (generalized): Secondary | ICD-10-CM

## 2015-03-22 DIAGNOSIS — G8929 Other chronic pain: Secondary | ICD-10-CM

## 2015-03-22 NOTE — Therapy (Signed)
[facility] [address] , [place], [address] Phone: [phone]   Fax:  [phone]  Physical Therapy Treatment  Patient Details  Name: Melissa Meyers E Couper MRN: [id number] Date of Birth: 21-May-1966 Referring Provider:  Kari BaarsHawkins, Edward, MD  Encounter Date: 03/22/2015      PT End of Session - 03/22/15 1645    Visit Number 2   Number of Visits 16   Date for PT Re-Evaluation 04/17/15   Authorization Type UHC   Authorization Time Period 03/20/15-05/20/15   Authorization - Visit Number 2   Authorization - Number of Visits 23   PT Start Time 1550   PT Stop Time 1640   PT Time Calculation (min) 50 min   Activity Tolerance Patient tolerated treatment well   Behavior During Therapy [facility] for tasks assessed/performed      Past Medical History  Diagnosis Date  . Acid reflux   . Sinus drainage   . Thyroid disease     Past Surgical History  Procedure Laterality Date  . Tubal ligation      There were no vitals filed for this visit.  Visit Diagnosis:  Cervical spondylosis without myelopathy  Chronic neck pain  Numbness and tingling in left upper extremity  Chronic scapular pain  Neck stiffness  Pain in left shoulder  Tight fascia  Muscle left arm weakness      Subjective Assessment - 03/21/15 1509    Subjective Pt is a 49yo white female who, 2 years ago, began to experience some L sided shoulder numbness/tingling that radiated into the hand. Pt initially saught treatemnt from OT. Additional followup from referring provider included imaging of cervical spine, which is thought to be contributing greatly to CC. Pt is now referred for cervical radiculopathy on L side.    Pertinent History Pt reports began two months ago. 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    How long can you sit comfortably? no problem related to this CC.    How long can  you stand comfortably? about 20 minutes with other limiting factors.    Diagnostic tests Imaging if CSpine reveals bony arhtirtic changes and bone spurs, per pt report.    Patient Stated Goals decrease pain, return to active lifestyle and running.    Currently in Pain? Yes   Pain Location Neck   Pain Orientation Left  near bulk of upper trap, palpable knot felt.    Pain Descriptors / Indicators Burning;Pins and needles;Tingling;Numbness   Pain Type Chronic pain   Pain Radiating Towards Total Left UE from shoulder to hand, both dorsal and volar.    Pain Onset Other (comment)  2 years ago.    Pain Frequency --  with all LUE movement and most cervical rotation.    Aggravating Factors  cervical rotation bilat, all sleeping positions, most UE functional use. resisted isometric triceps and biceps on L.    Effect of Pain on Daily Activities Very self limting.    Multiple Pain Sites Yes   Pain Location --  Sharp burning pain deneath the scapula with abduction on L.                          [facility] Adult PT Treatment/Exercise - 03/22/15 1642    Neck Exercises: Seated   Other Seated Exercise 3D cervical excursions 5X each   Other Seated Exercise thoracic excursion  with UE crossed 10 reps   Manual Therapy   Manual Therapy Soft tissue mobilization;Manual Traction;Myofascial release   Soft tissue mobilization seated to cervical and thoracic region   Myofascial Release seated to upper trap and rhomboid musculature   Manual Traction supine 5X30" holds                PT Education - 03/22/15 1649    Education provided Yes   Education Details 3D cervical excursions, relaxation   Person(s) Educated Patient   Methods Explanation;Handout;Demonstration;Tactile cues;Verbal cues   Comprehension Verbalized understanding;Need further instruction;Returned demonstration          PT Short Term Goals - 03/21/15 1532    PT SHORT TERM GOAL #1   Title By 2nd week pt will  demonstrate independence in starter HEP reporting good compliance at home without exacerbation of symptoms.    PT SHORT TERM GOAL #2   Title By end of thrid week, pt wil be able to decribe three ways to manage flare-up of symptoms at home to improve sself efficacy in pain management.    PT SHORT TERM GOAL #3   Title By week 4, pt will improve left cervical rotation to >55* without exacerbation of symptoms to improve functional use of haed turns in ADL, IADL.    PT SHORT TERM GOAL #4   Title By week four pt will improve bilat cervical sidebendign to ~35 degrees without exacerbation of symptoms to improve tolerance to multiple sleeping postiions overnight.             PT Long Term Goals - 03/21/15 1536    PT LONG TERM GOAL #1   Title By 8th week pt will demonstrate independence in advanced HEP reporting good compliance at home without exacerbation of symptoms to drive continued progress after DC.    PT LONG TERM GOAL #2   Title By week 8, pt will improve left cervical rotation to >65* without exacerbation of symptoms to improve functional use of haed turns in ADL, IADL.    PT LONG TERM GOAL #3   Title By week four pt will improve bilat cervical sidebending to ~40 degrees without exacerbation of symptoms to improve tolerance to multiple sleeping postiions overnight.                 Plan - 03/22/15 1645    Clinical Impression Statement Pt wtih multiple spasms in bilatereal upper traps.  Able to resolve 50% with soft tissue and myofascial techniques.  Pt requires frequent cues to decrease muscle guarding in throacic muslucature.  Performed manual traction in supine position with pt voicing overall reduction in pain, however not immediate or drastic reduction.  Continued pain with rotational head movmenets as well.     Clinical Impairments Affecting Rehab Potential Chronicity of current complaint.    PT Next Visit Plan Continue per POC: MFR to L Upper Traps, Bilat scalenes, chin tucks in  supine, manual cervical traction, L pec minor release, T-spine vectors for AROM.  If manual traction beneficial may want to progress to mechanical traction.    PT Home Exercise Plan Added 3D cervical excursions        Problem List There are no active problems to display for this patient.   Lurena Nida, PTA/CLT [phone]  03/22/2015, 4:50 PM  [facility] [facility] [address] [place], [place], [address] Phone: [phone]   Fax:  [phone]

## 2015-03-27 ENCOUNTER — Ambulatory Visit (HOSPITAL_COMMUNITY): Payer: 59 | Admitting: Physical Therapy

## 2015-03-27 DIAGNOSIS — M47812 Spondylosis without myelopathy or radiculopathy, cervical region: Secondary | ICD-10-CM | POA: Diagnosis not present

## 2015-03-27 DIAGNOSIS — G8929 Other chronic pain: Secondary | ICD-10-CM

## 2015-03-27 DIAGNOSIS — R202 Paresthesia of skin: Secondary | ICD-10-CM

## 2015-03-27 DIAGNOSIS — M542 Cervicalgia: Secondary | ICD-10-CM

## 2015-03-27 DIAGNOSIS — M629 Disorder of muscle, unspecified: Secondary | ICD-10-CM

## 2015-03-27 DIAGNOSIS — M75102 Unspecified rotator cuff tear or rupture of left shoulder, not specified as traumatic: Secondary | ICD-10-CM

## 2015-03-27 DIAGNOSIS — M25612 Stiffness of left shoulder, not elsewhere classified: Secondary | ICD-10-CM

## 2015-03-27 DIAGNOSIS — R200 Anesthesia of skin: Secondary | ICD-10-CM

## 2015-03-27 DIAGNOSIS — M25512 Pain in left shoulder: Secondary | ICD-10-CM

## 2015-03-27 DIAGNOSIS — M6281 Muscle weakness (generalized): Secondary | ICD-10-CM

## 2015-03-27 DIAGNOSIS — M898X1 Other specified disorders of bone, shoulder: Secondary | ICD-10-CM

## 2015-03-27 DIAGNOSIS — M436 Torticollis: Secondary | ICD-10-CM

## 2015-03-27 DIAGNOSIS — M6289 Other specified disorders of muscle: Secondary | ICD-10-CM

## 2015-03-27 NOTE — Therapy (Signed)
[facility] [address] [place], [place], [address] Phone: [phone]   Fax:  [phone]  Physical Therapy Treatment  Patient Details  Name: Melissa Meyers MRN: [id number] Date of Birth: 1966-06-01 No Data Recorded  Encounter Date: 03/27/2015      PT End of Session - 03/27/15 1749    Visit Number 3   Number of Visits 16   Date for PT Re-Evaluation 04/17/15   Authorization Type UHC   Authorization Time Period 03/20/15-05/20/15   Authorization - Visit Number 3   Authorization - Number of Visits 23   PT Start Time 1645   PT Stop Time 1730   PT Time Calculation (min) 45 min   Activity Tolerance Patient tolerated treatment well   Behavior During Therapy [facility] for tasks assessed/performed      Past Medical History  Diagnosis Date  . Acid reflux   . Sinus drainage   . Thyroid disease     Past Surgical History  Procedure Laterality Date  . Tubal ligation      There were no vitals filed for this visit.  Visit Diagnosis:  Cervical spondylosis without myelopathy  Chronic neck pain  Numbness and tingling in left upper extremity  Chronic scapular pain  Neck stiffness  Pain in left shoulder  Tight fascia  Muscle left arm weakness  Stiffness of left shoulder joint  Rotator cuff syndrome, left  Decreased range of motion of left shoulder      Subjective Assessment - 03/27/15 1804    Subjective Pt states no change in pain.  States she had a little relief that evening after last session but then pain returned unchanged.                         [facility] Adult PT Treatment/Exercise - 03/27/15 1745    Neck Exercises: Standing   Upper Extremity Flexion with Stabilization 10 reps   UE Flexion with Stabilization Limitations facing wall sliding up   Other Standing Exercises corner stretch 3X15"   Neck Exercises: Seated   Other Seated Exercise 3D cervical excursions 5X each   Modalities   Modalities  Ultrasound   Ultrasound   Ultrasound Location Lt upper trap and Lt rhomboid regions   Ultrasound Parameters 1.5w/cm2 continuous 4' each area, 8' total   Ultrasound Goals Pain   Manual Therapy   Manual Therapy Soft tissue mobilization   Soft tissue mobilization seated to cervical and thoracic region                  PT Short Term Goals - 03/21/15 1532    PT SHORT TERM GOAL #1   Title By 2nd week pt will demonstrate independence in starter HEP reporting good compliance at home without exacerbation of symptoms.    PT SHORT TERM GOAL #2   Title By end of thrid week, pt wil be able to decribe three ways to manage flare-up of symptoms at home to improve sself efficacy in pain management.    PT SHORT TERM GOAL #3   Title By week 4, pt will improve left cervical rotation to >55* without exacerbation of symptoms to improve functional use of haed turns in ADL, IADL.    PT SHORT TERM GOAL #4   Title By week four pt will improve bilat cervical sidebendign to ~35 degrees without exacerbation of symptoms to improve tolerance to multiple sleeping postiions overnight.  PT Long Term Goals - 03/21/15 1536    PT LONG TERM GOAL #1   Title By 8th week pt will demonstrate independence in advanced HEP reporting good compliance at home without exacerbation of symptoms to drive continued progress after DC.    PT LONG TERM GOAL #2   Title By week 8, pt will improve left cervical rotation to >65* without exacerbation of symptoms to improve functional use of haed turns in ADL, IADL.    PT LONG TERM GOAL #3   Title By week four pt will improve bilat cervical sidebending to ~40 degrees without exacerbation of symptoms to improve tolerance to multiple sleeping postiions overnight.                 Plan - 03/27/15 1750    Clinical Impression Statement Pt without relief last session and no reported benefits from traction.  comes today with same complaints and areas affected.   Progressed with theraband postural therex, corner stretch and UE flexion to increase postural muscle strength and relax flexors.  Added Ultrasound prior to manual work to help relax muscles and promote general relaxation.  Multiple spasms in rhomboid region and one large spasm in upper trap therapist able to resolve with manual .  Pt reported overall reduction of pain to 5/10 at end of session.  In addition, observed patient walking out of clinic with full cervical rotation as she was talking with another patient.     Clinical Impairments Affecting Rehab Potential Chronicity of current complaint.    PT Next Visit Plan Continue per POC: MFR to L Upper Traps, Bilat scalenes, chin tucks in supine, manual cervical traction, L pec minor release, T-spine vectors for AROM. Assess relief from US/manual treatment.  Revise if needed.     PT Home Exercise Plan Added 3D cervical excursions        Problem List There are no active problems to display for this patient.  Lurena Nidamy B Frazier, PTA/CLT [phone] 03/27/2015, 6:06 PM  [facility] [address] [place], [place], [address] Phone: [phone]   Fax:  [phone]  Name: Melissa Meyers MRN: [id number] Date of Birth: 1966-06-01

## 2015-03-28 ENCOUNTER — Telehealth (HOSPITAL_COMMUNITY): Payer: Self-pay

## 2015-03-29 ENCOUNTER — Ambulatory Visit (HOSPITAL_COMMUNITY): Payer: 59 | Admitting: Physical Therapy

## 2015-03-29 DIAGNOSIS — M47812 Spondylosis without myelopathy or radiculopathy, cervical region: Secondary | ICD-10-CM | POA: Diagnosis not present

## 2015-03-29 DIAGNOSIS — M629 Disorder of muscle, unspecified: Secondary | ICD-10-CM

## 2015-03-29 DIAGNOSIS — M6289 Other specified disorders of muscle: Secondary | ICD-10-CM

## 2015-03-29 DIAGNOSIS — M542 Cervicalgia: Secondary | ICD-10-CM

## 2015-03-29 DIAGNOSIS — R202 Paresthesia of skin: Secondary | ICD-10-CM

## 2015-03-29 DIAGNOSIS — M436 Torticollis: Secondary | ICD-10-CM

## 2015-03-29 DIAGNOSIS — M898X1 Other specified disorders of bone, shoulder: Secondary | ICD-10-CM

## 2015-03-29 DIAGNOSIS — M25512 Pain in left shoulder: Secondary | ICD-10-CM

## 2015-03-29 DIAGNOSIS — R200 Anesthesia of skin: Secondary | ICD-10-CM

## 2015-03-29 DIAGNOSIS — G8929 Other chronic pain: Secondary | ICD-10-CM

## 2015-03-29 NOTE — Therapy (Signed)
[place] [facility] [address] [place], [place], [address] Phone: [phone]   Fax:  [phone]  Physical Therapy Treatment  Patient Details  Name: Melissa Meyers MRN: [id number] Date of Birth: [birth year] No Data Recorded  Encounter Date: 03/29/2015      PT End of Session - 03/29/15 1614    Visit Number 3   Number of Visits 16   Date for PT Re-Evaluation 04/17/15   Authorization Type UHC   Authorization Time Period 03/20/15-05/20/15   Authorization - Visit Number 3   Authorization - Number of Visits 23   PT Start Time 1430   PT Stop Time 1515   PT Time Calculation (min) 45 min   Activity Tolerance Patient tolerated treatment well   Behavior During Therapy [facility] for tasks assessed/performed      Past Medical History  Diagnosis Date  . Acid reflux   . Sinus drainage   . Thyroid disease     Past Surgical History  Procedure Laterality Date  . Tubal ligation      There were no vitals filed for this visit.  Visit Diagnosis:  Cervical spondylosis without myelopathy  Chronic neck pain  Numbness and tingling in left upper extremity  Chronic scapular pain  Neck stiffness  Pain in left shoulder  Tight fascia      Subjective Assessment - 03/29/15 1621    Subjective PT states her pain remains constant and feels increased soreness today thinking it is due to having a cold.  States she wants to just go home and curl up in her bed.    Currently in Pain? Yes   Pain Score 5    Pain Location Scapula   Pain Orientation Left   Pain Descriptors / Indicators Sore                         [facility] Adult PT Treatment/Exercise - 03/29/15 0001    Neck Exercises: Machines for Strengthening   UBE (Upper Arm Bike) 3 minutes backward   Neck Exercises: Theraband   Scapula Retraction 10 reps;Red   Shoulder Extension 10 reps;Red   Rows 10 reps;Red   Neck Exercises: Standing   Upper Extremity Flexion with Stabilization 10  reps   Other Standing Exercises corner stretch 3X30"                  PT Short Term Goals - 03/21/15 1532    PT SHORT TERM GOAL #1   Title By 2nd week pt will demonstrate independence in starter HEP reporting good compliance at home without exacerbation of symptoms.    PT SHORT TERM GOAL #2   Title By end of thrid week, pt wil be able to decribe three ways to manage flare-up of symptoms at home to improve sself efficacy in pain management.    PT SHORT TERM GOAL #3   Title By week 4, pt will improve left cervical rotation to >55* without exacerbation of symptoms to improve functional use of haed turns in ADL, IADL.    PT SHORT TERM GOAL #4   Title By week four pt will improve bilat cervical sidebendign to ~35 degrees without exacerbation of symptoms to improve tolerance to multiple sleeping postiions overnight.             PT Long Term Goals - 03/21/15 1536    PT LONG TERM GOAL #1   Title By 8th week pt will demonstrate independence in advanced  HEP reporting good compliance at home without exacerbation of symptoms to drive continued progress after DC.    PT LONG TERM GOAL #2   Title By week 8, pt will improve left cervical rotation to >65* without exacerbation of symptoms to improve functional use of haed turns in ADL, IADL.    PT LONG TERM GOAL #3   Title By week four pt will improve bilat cervical sidebending to ~40 degrees without exacerbation of symptoms to improve tolerance to multiple sleeping postiions overnight.                 Plan - 03/29/15 1615    Clinical Impression Statement Pt reports general soreness in upper trap region with constant pain as last session.  Continued with exericse progression, discontinuing other modalities as these have not been beneficial.  Continued with manual techniques to decrease spasms and tightness in Lt scapular and cervical regions.   Noted reduction in upper trap and only mild spasm in rhomboid region.  PT without sensitivity  in this area this session.  No complaints with any therex completed today.  Continued cues needed from thereapist wtih theraband exercises for form.    Clinical Impairments Affecting Rehab Potential Chronicity of current complaint.    PT Next Visit Plan Continue per POC: MFR to L Upper Traps, Bilat scalenes, chin tucks in supine, manual cervical traction, L pec minor release, T-spine vectors for AROM. Assess relief from US/manual treatment.  Revise if needed.     PT Home Exercise Plan Added 3D cervical excursions        Problem List There are no active problems to display for this patient.   Lurena Nidamy B Triston Lisanti, PTA/CLT [phone]  03/29/2015, 4:24 PM  [place] [facility] [address] [place], [place], [address] Phone: [phone]   Fax:  [phone]  Name: Patrici Ranksda E Cordon MRN: [id number] Date of Birth: [birth year]

## 2015-04-02 ENCOUNTER — Ambulatory Visit (HOSPITAL_COMMUNITY): Payer: 59 | Admitting: Physical Therapy

## 2015-04-03 ENCOUNTER — Ambulatory Visit (HOSPITAL_COMMUNITY): Payer: 59 | Admitting: Physical Therapy

## 2015-04-03 DIAGNOSIS — M25512 Pain in left shoulder: Secondary | ICD-10-CM

## 2015-04-03 DIAGNOSIS — G8929 Other chronic pain: Secondary | ICD-10-CM

## 2015-04-03 DIAGNOSIS — M898X1 Other specified disorders of bone, shoulder: Secondary | ICD-10-CM

## 2015-04-03 DIAGNOSIS — R200 Anesthesia of skin: Secondary | ICD-10-CM

## 2015-04-03 DIAGNOSIS — M47812 Spondylosis without myelopathy or radiculopathy, cervical region: Secondary | ICD-10-CM | POA: Diagnosis not present

## 2015-04-03 DIAGNOSIS — M542 Cervicalgia: Secondary | ICD-10-CM

## 2015-04-03 DIAGNOSIS — M436 Torticollis: Secondary | ICD-10-CM

## 2015-04-03 DIAGNOSIS — R202 Paresthesia of skin: Secondary | ICD-10-CM

## 2015-04-03 NOTE — Therapy (Signed)
[place] [facility] [address] [place], [place], [address] Phone: [phone]   Fax:  [phone]  Physical Therapy Treatment  Patient Details  Name: Melissa Meyers MRN: [id number] Date of Birth: 1966/01/20 No Data Recorded  Encounter Date: 04/03/2015      PT End of Session - 04/03/15 1619    Visit Number 4   Number of Visits 16   Date for PT Re-Evaluation 04/17/15   Authorization Type UHC   Authorization Time Period 03/20/15-05/20/15   Authorization - Visit Number 4   Authorization - Number of Visits 23   PT Start Time 1430   PT Stop Time 1510   PT Time Calculation (min) 40 min   Activity Tolerance Patient tolerated treatment well   Behavior [facility] assessed/performed      Past Medical History  Diagnosis Date  . Acid reflux   . Sinus drainage   . Thyroid disease     Past Surgical History  Procedure Laterality Date  . Tubal ligation      There were no vitals filed for this visit.  Visit Diagnosis:  Cervical spondylosis without myelopathy  Chronic neck pain  Numbness and tingling in left upper extremity  Chronic scapular pain  Neck stiffness  Pain in left shoulder      Subjective Assessment - 04/03/15 1431    Subjective Patient reports that her pain has not really changed and that she has not noticed  a big change so far; she was still sick this weekend   Pertinent History Pt reports began two months ago. 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    Currently in Pain? Yes   Pain Score 7    Pain Location Neck  neck and shoulder blade    Pain Orientation Left                         [facility] Adult PT Treatment/Exercise - 04/03/15 0001    Neck Exercises: Seated   Cervical Isometrics Right lateral flexion;Left lateral flexion   Neck Retraction 10 reps   Other Seated Exercise 3D cervical and thoracic  excursions 1x10 each direction    Other Seated Exercise posterior shoulder rolls, scapular regractions 1x15    Shoulder Exercises: Standing   Other Standing Exercises isometric shoulder IR 1x10   Manual Therapy   Manual Therapy Soft tissue mobilization;Myofascial release   Soft tissue mobilization focus on L upper trap today   Myofascial Release attempted myofascial release to subscapularis however patient unable to tolerate pressure needed to fully reach muscle belly    Neck Exercises: Stretches   Upper Trapezius Stretch 2 reps;20 seconds   Corner Stretch 2 reps;30 seconds                PT Education - 04/03/15 1619    Education provided Yes   Education Details possible subscapularis irritation    Person(s) Educated Patient   Methods Explanation   Comprehension Verbalized understanding          PT Short Term Goals - 03/21/15 1532    PT SHORT TERM GOAL #1   Title By 2nd week pt will demonstrate independence in starter HEP reporting good compliance at home without exacerbation of symptoms.    PT SHORT TERM GOAL #2   Title By end of thrid week, pt wil be able to decribe three  ways to manage flare-up of symptoms at home to improve sself efficacy in pain management.    PT SHORT TERM GOAL #3   Title By week 4, pt will improve left cervical rotation to >55* without exacerbation of symptoms to improve functional use of haed turns in ADL, IADL.    PT SHORT TERM GOAL #4   Title By week four pt will improve bilat cervical sidebendign to ~35 degrees without exacerbation of symptoms to improve tolerance to multiple sleeping postiions overnight.             PT Long Term Goals - 03/21/15 1536    PT LONG TERM GOAL #1   Title By 8th week pt will demonstrate independence in advanced HEP reporting good compliance at home without exacerbation of symptoms to drive continued progress after DC.    PT LONG TERM GOAL #2   Title By week 8, pt will improve left cervical rotation to >65*  without exacerbation of symptoms to improve functional use of haed turns in ADL, IADL.    PT LONG TERM GOAL #3   Title By week four pt will improve bilat cervical sidebending to ~40 degrees without exacerbation of symptoms to improve tolerance to multiple sleeping postiions overnight.                 Plan - 04/03/15 1620    Clinical Impression Statement Patient presents today with ongoing neck pain that is also in her shoulder blade today. Continued mobility exercises and stabilzation exercises today and also investigated possible subscapularis irritation as patient does have pain under shoulder blade, pain with shoulder IR, and tenderness in subscapularis area, however she was unable to tolerate  myofascial release to this area due to pain. Continued with manual to upper traps otherwise. No significant decrease in pain at end of session.     Pt will benefit from skilled therapeutic intervention in order to improve on the following deficits Decreased activity tolerance;Decreased range of motion;Decreased mobility;Decreased strength;Impaired flexibility;Improper body mechanics;Postural dysfunction;Pain   Rehab Potential Good   Clinical Impairments Affecting Rehab Potential Chronicity of current complaint.    PT Frequency 2x / week   PT Duration 4 weeks   PT Treatment/Interventions ADLs/Self Care Home Management;Manual techniques;Therapeutic activities;Functional mobility training;Therapeutic exercise;Passive range of motion;Moist Heat;Traction;Patient/family education   PT Next Visit Plan Continue per POC: MFR to L Upper Traps, Bilat scalenes, chin tucks in supine, manual cervical traction, L pec minor release, T-spine vectors for AROM. Assess relief from US/manual treatment.  Revise if needed.  Continue to trial subscapularis mobilization.    PT Home Exercise Plan Added 3D cervical excursions   Consulted and Agree with Plan of Care Patient        Problem List There are no active  problems to display for this patient.   Nedra Hai PT, DPT [phone]  [facility] [address] [place], [place], [address] Phone: [phone]   Fax:  [phone]  Name: IREM STONEHAM MRN: [id number] Date of Birth: [birth year]

## 2015-04-04 ENCOUNTER — Encounter (HOSPITAL_COMMUNITY): Payer: Self-pay

## 2015-04-05 ENCOUNTER — Ambulatory Visit (HOSPITAL_COMMUNITY): Payer: 59

## 2015-04-05 DIAGNOSIS — G8929 Other chronic pain: Secondary | ICD-10-CM

## 2015-04-05 DIAGNOSIS — R202 Paresthesia of skin: Secondary | ICD-10-CM

## 2015-04-05 DIAGNOSIS — M542 Cervicalgia: Secondary | ICD-10-CM

## 2015-04-05 DIAGNOSIS — M898X1 Other specified disorders of bone, shoulder: Secondary | ICD-10-CM

## 2015-04-05 DIAGNOSIS — R200 Anesthesia of skin: Secondary | ICD-10-CM

## 2015-04-05 DIAGNOSIS — M47812 Spondylosis without myelopathy or radiculopathy, cervical region: Secondary | ICD-10-CM

## 2015-04-05 DIAGNOSIS — M436 Torticollis: Secondary | ICD-10-CM

## 2015-04-05 NOTE — Therapy (Signed)
[place] [facility] [address] [place], [place], [address] Phone: [phone]   Fax:  [phone]  Physical Therapy Treatment  Patient Details  Name: Melissa Meyers MRN: [id number] Date of Birth: Aug 30, 1965 No Data Recorded  Encounter Date: 04/05/2015      PT End of Session - 04/05/15 1735    Visit Number 5   Number of Visits 16   Date for PT Re-Evaluation 04/17/15   Authorization Type UHC   Authorization Time Period 03/20/15-05/20/15   Authorization - Visit Number 5   Authorization - Number of Visits 23   PT Start Time 1734   PT Stop Time 1820   PT Time Calculation (min) 46 min   Activity Tolerance Patient tolerated treatment well   Behavior During Therapy [facility] for tasks assessed/performed      Past Medical History  Diagnosis Date  . Acid reflux   . Sinus drainage   . Thyroid disease     Past Surgical History  Procedure Laterality Date  . Tubal ligation      There were no vitals filed for this visit.  Visit Diagnosis:  Cervical spondylosis without myelopathy  Chronic neck pain  Numbness and tingling in left upper extremity  Chronic scapular pain  Neck stiffness      Subjective Assessment - 04/05/15 1729    Subjective Pt stated she continues to suffer with a cold, is on antibiotics and other medication for her cold.  Current pain scale 6/10 Lt neck soreness today.   Currently in Pain? Yes   Pain Score 6    Pain Location Neck   Pain Orientation Left   Pain Descriptors / Indicators Sore            [facility] Adult PT Treatment/Exercise - 04/05/15 0001    Neck Exercises: Theraband   Shoulder Extension 10 reps;Red   Rows 10 reps;Red   Neck Exercises: Seated   Shoulder Rolls Backwards;10 reps   Shoulder Rolls Limitations up, back and down   Other Seated Exercise 3D cervical and thoracic excursions 1x10 each direction    Other Seated Exercise Scapular retraction and rows 15x   Manual Therapy   Manual Therapy Soft  tissue mobilization;Myofascial release   Soft tissue mobilization Position release techniuqe followed by STM to Lt upper traps, STM to Bil scalenes, posterior cervical musculature, Lt subscapularis with UE flexion supine position with LE elevated   Manual Traction supine 3x 60" holds             PT Short Term Goals - 03/21/15 1532    PT SHORT TERM GOAL #1   Title By 2nd week pt will demonstrate independence in starter HEP reporting good compliance at home without exacerbation of symptoms.    PT SHORT TERM GOAL #2   Title By end of thrid week, pt wil be able to decribe three ways to manage flare-up of symptoms at home to improve sself efficacy in pain management.    PT SHORT TERM GOAL #3   Title By week 4, pt will improve left cervical rotation to >55* without exacerbation of symptoms to improve functional use of haed turns in ADL, IADL.    PT SHORT TERM GOAL #4   Title By week four pt will improve bilat cervical sidebendign to ~35 degrees without exacerbation of symptoms to improve tolerance to multiple sleeping postiions overnight.             PT Long Term Goals - 03/21/15 1536  PT LONG TERM GOAL #1   Title By 8th week pt will demonstrate independence in advanced HEP reporting good compliance at home without exacerbation of symptoms to drive continued progress after DC.    PT LONG TERM GOAL #2   Title By week 8, pt will improve left cervical rotation to >65* without exacerbation of symptoms to improve functional use of haed turns in ADL, IADL.    PT LONG TERM GOAL #3   Title By week four pt will improve bilat cervical sidebending to ~40 degrees without exacerbation of symptoms to improve tolerance to multiple sleeping postiions overnight.                 Plan - 04/05/15 1824    Clinical Impression Statement Session focus on improving cervical ROM in pain free range, postural strengthening and manual techniques to reduce pain.  Pt able to demonstrate appropriate form and  technique with all exercises with minimal verbal cueing to improve posture to reduce forward head posture during exercises.  Pt reported relief following position release technqiues to Lt Upper traps with improved cervical rotation and minimal pain with movement, soft tissue techniques complete to reduce overall tightness especially scalenes and subscapularis.  Pt encouraged to stay hydrated following manual.     PT Next Visit Plan Continue per POC: MFR to L Upper Traps, Bilat scalenes, chin tucks in supine, manual cervical traction, L pec minor release, 3D thoracic excursion. Assess relief from manual treatment.  Revise if needed.  Continue to trial subscapularis mobilization.         Problem List There are no active problems to display for this patient.  [address] LaneCasey Cockerham, LPTA; CBIS [phone]  Juel BurrowCockerham, Casey Jo 04/05/2015, 6:30 PM  [place] [facility] [address] [place], [place], [address] Phone: [phone]   Fax:  [phone]  Name: Melissa Meyers MRN: [id number] Date of Birth: 1965-12-16

## 2015-04-10 ENCOUNTER — Ambulatory Visit (HOSPITAL_COMMUNITY): Payer: 59 | Attending: Pulmonary Disease

## 2015-04-10 DIAGNOSIS — R200 Anesthesia of skin: Secondary | ICD-10-CM | POA: Diagnosis present

## 2015-04-10 DIAGNOSIS — M898X1 Other specified disorders of bone, shoulder: Secondary | ICD-10-CM | POA: Insufficient documentation

## 2015-04-10 DIAGNOSIS — M542 Cervicalgia: Secondary | ICD-10-CM | POA: Insufficient documentation

## 2015-04-10 DIAGNOSIS — M47812 Spondylosis without myelopathy or radiculopathy, cervical region: Secondary | ICD-10-CM | POA: Insufficient documentation

## 2015-04-10 DIAGNOSIS — M629 Disorder of muscle, unspecified: Secondary | ICD-10-CM | POA: Insufficient documentation

## 2015-04-10 DIAGNOSIS — M25512 Pain in left shoulder: Secondary | ICD-10-CM | POA: Diagnosis present

## 2015-04-10 DIAGNOSIS — G8929 Other chronic pain: Secondary | ICD-10-CM | POA: Diagnosis present

## 2015-04-10 DIAGNOSIS — M25612 Stiffness of left shoulder, not elsewhere classified: Secondary | ICD-10-CM | POA: Insufficient documentation

## 2015-04-10 DIAGNOSIS — M6281 Muscle weakness (generalized): Secondary | ICD-10-CM | POA: Insufficient documentation

## 2015-04-10 DIAGNOSIS — R202 Paresthesia of skin: Secondary | ICD-10-CM

## 2015-04-10 DIAGNOSIS — M436 Torticollis: Secondary | ICD-10-CM

## 2015-04-10 NOTE — Therapy (Signed)
[place] [facility] [address] [place], [place], [address] Phone: [phone]   Fax:  [phone]  Physical Therapy Treatment  Patient Details  Name: BRION HEDGES MRN: [id number] Date of Birth: 05/24/1966 No Data Recorded  Encounter Date: 04/10/2015      PT End of Session - 04/10/15 1650    Visit Number 6   Number of Visits 16   Date for PT Re-Evaluation 04/17/15   Authorization Type UHC   Authorization Time Period 03/20/15-05/20/15   Authorization - Visit Number 6   Authorization - Number of Visits 23   PT Start Time 1650   PT Stop Time 1730   PT Time Calculation (min) 40 min   Activity Tolerance Patient tolerated treatment well   Behavior During Therapy [facility] for tasks assessed/performed      Past Medical History  Diagnosis Date  . Acid reflux   . Sinus drainage   . Thyroid disease     Past Surgical History  Procedure Laterality Date  . Tubal ligation      There were no vitals filed for this visit.  Visit Diagnosis:  Cervical spondylosis without myelopathy  Chronic neck pain  Numbness and tingling in left upper extremity  Chronic scapular pain  Neck stiffness      Subjective Assessment - 04/10/15 1643    Subjective Pt stated she feels really stiff today, done with antibiotics but does continue to cough and increase tension, pain scale 7/10 Lt side of neck   Currently in Pain? Yes   Pain Score 7    Pain Location Neck   Pain Orientation Left   Pain Descriptors / Indicators Aching;Sore;Tightness           [facility] Adult PT Treatment/Exercise - 04/10/15 0001    Neck Exercises: Theraband   Scapula Retraction 10 reps;Red   Shoulder Extension 10 reps;Red   Rows 10 reps;Red   Neck Exercises: Seated   X to V 10 reps   W Back Weights (lbs) 10   Shoulder Rolls Backwards;10 reps   Shoulder Rolls Limitations up, back and down   Other Seated Exercise 3D cervical and thoracic excursions 1x10 each direction    Manual  Therapy   Manual Therapy Soft tissue mobilization;Myofascial release   Soft tissue mobilization Position release techniuqe followed by STM to Lt upper traps, STM to Bil scalenes, posterior cervical musculature, Lt subscapularis with UE flexion supine position with LE elevated   Manual Traction supine 3x 60" holds           PT Short Term Goals - 03/21/15 1532    PT SHORT TERM GOAL #1   Title By 2nd week pt will demonstrate independence in starter HEP reporting good compliance at home without exacerbation of symptoms.    PT SHORT TERM GOAL #2   Title By end of thrid week, pt wil be able to decribe three ways to manage flare-up of symptoms at home to improve sself efficacy in pain management.    PT SHORT TERM GOAL #3   Title By week 4, pt will improve left cervical rotation to >55* without exacerbation of symptoms to improve functional use of haed turns in ADL, IADL.    PT SHORT TERM GOAL #4   Title By week four pt will improve bilat cervical sidebendign to ~35 degrees without exacerbation of symptoms to improve tolerance to multiple sleeping postiions overnight.             PT Long Term  Goals - 03/21/15 1536    PT LONG TERM GOAL #1   Title By 8th week pt will demonstrate independence in advanced HEP reporting good compliance at home without exacerbation of symptoms to drive continued progress after DC.    PT LONG TERM GOAL #2   Title By week 8, pt will improve left cervical rotation to >65* without exacerbation of symptoms to improve functional use of haed turns in ADL, IADL.    PT LONG TERM GOAL #3   Title By week four pt will improve bilat cervical sidebending to ~40 degrees without exacerbation of symptoms to improve tolerance to multiple sleeping postiions overnight.                 Plan - 04/10/15 1828    Clinical Impression Statement Continued session focus on improving pain free cervical ROM and postural strenghtening.  Added w back and x to v for postural  strenghtening.  Pt required verbal and tactile cueing to reduce forward headed and forward rolled shoulders to improve posture.  Ended session wtih manual techniques to reduce tightness and pain cervica musculature.  Positive results following position release technique for Lt uper trap with reduction of spasms.  Pt encouraged to stay hydrated following manual.   PT Next Visit Plan Continue per POC: MFR to L Upper Traps, Bilat scalenes, chin tucks in supine, manual cervical traction, L pec minor release, 3D thoracic excursion. Assess relief from manual treatment.  Revise if needed.  Continue to trial subscapularis mobilization.         Problem List There are no active problems to display for this patient.  [address]Casey Cockerham, LPTA; CBIS [phone]  Juel BurrowCockerham, Casey Jo 04/10/2015, 6:34 PM  [facility] [address] , [place], [address] Phone: [phone]   Fax:  [phone]  Name: Patrici Ranksda E Grondahl MRN: [id number] Date of Birth: 06-14-1965

## 2015-04-12 ENCOUNTER — Ambulatory Visit (HOSPITAL_COMMUNITY): Payer: 59

## 2015-04-12 DIAGNOSIS — M47812 Spondylosis without myelopathy or radiculopathy, cervical region: Secondary | ICD-10-CM

## 2015-04-12 DIAGNOSIS — G8929 Other chronic pain: Secondary | ICD-10-CM

## 2015-04-12 DIAGNOSIS — M898X1 Other specified disorders of bone, shoulder: Secondary | ICD-10-CM

## 2015-04-12 DIAGNOSIS — M436 Torticollis: Secondary | ICD-10-CM

## 2015-04-12 DIAGNOSIS — R200 Anesthesia of skin: Secondary | ICD-10-CM

## 2015-04-12 DIAGNOSIS — R202 Paresthesia of skin: Secondary | ICD-10-CM

## 2015-04-12 DIAGNOSIS — M542 Cervicalgia: Secondary | ICD-10-CM

## 2015-04-12 NOTE — Therapy (Signed)
[facility] [address] [place], [place], [address] Phone: [phone]   Fax:  [phone]  Physical Therapy Treatment  Patient Details  Name: Melissa Meyers MRN: [id number] Date of Birth: 1965-11-06 No Data Recorded  Encounter Date: 04/12/2015      PT End of Session - 04/12/15 1813    Visit Number 7   Number of Visits 16   Date for PT Re-Evaluation 04/17/15   Authorization Type UHC   Authorization Time Period 03/20/15-05/20/15   Authorization - Visit Number 7   Authorization - Number of Visits 23   PT Start Time 1650   PT Stop Time 1741   PT Time Calculation (min) 51 min   Activity Tolerance Patient tolerated treatment well   Behavior During Therapy [facility] for tasks assessed/performed      Past Medical History  Diagnosis Date  . Acid reflux   . Sinus drainage   . Thyroid disease     Past Surgical History  Procedure Laterality Date  . Tubal ligation      There were no vitals filed for this visit.  Visit Diagnosis:  Cervical spondylosis without myelopathy  Chronic neck pain  Numbness and tingling in left upper extremity  Chronic scapular pain  Neck stiffness      Subjective Assessment - 04/12/15 1655    Subjective Pt stated her whole back is bothering her today, feels the manual techniques have been assisting, no reports of radicular symtoms today, pain scale 7-8/10 Lt >Rt  Pt continues to c/o coughing that increases thoracic pain when coughing, done with medication   Currently in Pain? Yes   Pain Score 8    Pain Location Back  Cervical, thoracic and lumbar region   Pain Orientation --  Lt > Rt   Pain Descriptors / Indicators Aching             [facility] Adult PT Treatment/Exercise - 04/12/15 0001    Neck Exercises: Seated   Shoulder Rolls Backwards;10 reps   Shoulder Rolls Limitations up, back and down   Other Seated Exercise Posterior pelvic tilt with tactile cueing 5x 5"   Other Seated Exercise Scapular  retraction and rows 15x   Neck Exercises: Supine   Neck Retraction 10 reps;5 secs   Neck Retraction Limitations verbal cueing   Other Supine Exercise Pelvic tilts 5x 5" with verbal and tactile cueing    Neck Exercises: Prone   Neck Retraction 5 reps;5 secs   Manual Therapy   Manual Therapy Soft tissue mobilization;Myofascial release   Soft tissue mobilization Position release techniques for Lt UT in supine with LE elevated.  STM to scalenes, pecs, upper traps,  Prone upper and mid traps, scapular musculature with increased time on rhomboids   Neck Exercises: Stretches   Other Neck Stretches arms crossed at chest and forward flexion to knees 5x 10"   Other Neck Stretches Mad cat to neutral (no extension) 5x 20", Child's pose 5x 20"            PT Short Term Goals - 03/21/15 1532    PT SHORT TERM GOAL #1   Title By 2nd week pt will demonstrate independence in starter HEP reporting good compliance at home without exacerbation of symptoms.    PT SHORT TERM GOAL #2   Title By end of thrid week, pt wil be able to decribe three ways to manage flare-up of symptoms at home to improve sself efficacy in pain management.  PT SHORT TERM GOAL #3   Title By week 4, pt will improve left cervical rotation to >55* without exacerbation of symptoms to improve functional use of haed turns in ADL, IADL.    PT SHORT TERM GOAL #4   Title By week four pt will improve bilat cervical sidebendign to ~35 degrees without exacerbation of symptoms to improve tolerance to multiple sleeping postiions overnight.             PT Long Term Goals - 03/21/15 1536    PT LONG TERM GOAL #1   Title By 8th week pt will demonstrate independence in advanced HEP reporting good compliance at home without exacerbation of symptoms to drive continued progress after DC.    PT LONG TERM GOAL #2   Title By week 8, pt will improve left cervical rotation to >65* without exacerbation of symptoms to improve functional use of haed  turns in ADL, IADL.    PT LONG TERM GOAL #3   Title By week four pt will improve bilat cervical sidebending to ~40 degrees without exacerbation of symptoms to improve tolerance to multiple sleeping postiions overnight.                 Plan - 04/12/15 1751    Clinical Impression Statement Session focus on improving spinal mobility with education on importance of proper posture.  Pt demonstrates excessive lumbar lordosis, hyperkyphotic posture with forward rolled shoulder and forward head with cervical extension.  Followind discussion with evaluation PT, session focus on improving spinal flexion to reduce lumbar lordosis and improve posutre.  Continued with scapular and cervical retraction exercises with min verbal and tactile cueing for proer technique.  Ended session wtih manual techniques to address spasms Bil Upper trap Lt>rt, pec tightness and multiple spasms addressed on periscapular musculautre especailly rhomboids Lt>rt.  Pt reported no tingling sensations through session and reduction of cervical pain following manual.     PT Next Visit Plan F/u with new treatment this session.  Continue focus on improving posture with spinal flexion for thoracic and lumbar region, pec stretches and manual to cervical and thoracic musculature.        Problem List There are no active problems to display for this patient.  [address] Longbranch RoadCasey Redina Meyers, LPTA; CBIS [phone]  Juel BurrowCockerham, Melissa Meyers 04/12/2015, 6:36 PM  [facility] [address] [place], [place], [address] Phone: [phone]   Fax:  [phone]  Name: Melissa Meyers MRN: [id number] Date of Birth: 1966-04-14

## 2015-04-18 ENCOUNTER — Ambulatory Visit (HOSPITAL_COMMUNITY): Payer: 59 | Admitting: Physical Therapy

## 2015-04-18 DIAGNOSIS — M47812 Spondylosis without myelopathy or radiculopathy, cervical region: Secondary | ICD-10-CM

## 2015-04-18 DIAGNOSIS — R200 Anesthesia of skin: Secondary | ICD-10-CM

## 2015-04-18 DIAGNOSIS — R202 Paresthesia of skin: Secondary | ICD-10-CM

## 2015-04-18 DIAGNOSIS — M25512 Pain in left shoulder: Secondary | ICD-10-CM

## 2015-04-18 DIAGNOSIS — M629 Disorder of muscle, unspecified: Secondary | ICD-10-CM

## 2015-04-18 DIAGNOSIS — M6281 Muscle weakness (generalized): Secondary | ICD-10-CM

## 2015-04-18 DIAGNOSIS — M436 Torticollis: Secondary | ICD-10-CM

## 2015-04-18 DIAGNOSIS — M898X1 Other specified disorders of bone, shoulder: Secondary | ICD-10-CM

## 2015-04-18 DIAGNOSIS — G8929 Other chronic pain: Secondary | ICD-10-CM

## 2015-04-18 DIAGNOSIS — M6289 Other specified disorders of muscle: Secondary | ICD-10-CM

## 2015-04-18 DIAGNOSIS — M542 Cervicalgia: Secondary | ICD-10-CM

## 2015-04-18 NOTE — Therapy (Signed)
[facility] [address] [place], [place], [address] Phone: [phone]   Fax:  [phone]  Physical Therapy Treatment  Patient Details  Name: Melissa Meyers MRN: [id number] Date of Birth: [birth year] No Data Recorded  Encounter Date: 04/18/2015      PT End of Session - 04/18/15 1638    Visit Number 8   Number of Visits 16   Date for PT Re-Evaluation 04/24/15   Authorization Type UHC   Authorization Time Period 03/20/15-05/20/15   Authorization - Visit Number 8   Authorization - Number of Visits 23   PT Start Time 1600   PT Stop Time 1640   PT Time Calculation (min) 40 min   Activity Tolerance Patient tolerated treatment well   Behavior During Therapy [facility] for tasks assessed/performed      Past Medical History  Diagnosis Date  . Acid reflux   . Sinus drainage   . Thyroid disease     Past Surgical History  Procedure Laterality Date  . Tubal ligation      There were no vitals filed for this visit.  Visit Diagnosis:  Cervical spondylosis without myelopathy  Chronic neck pain  Numbness and tingling in left upper extremity  Chronic scapular pain  Neck stiffness  Pain in left shoulder  Tight fascia  Muscle left arm weakness                       [facility] Adult PT Treatment/Exercise - 04/18/15 1614    Neck Exercises: Machines for Strengthening   UBE (Upper Arm Bike) 4 minutes backward level 1 at end of session   Neck Exercises: Theraband   Scapula Retraction 10 reps;Green   Shoulder Extension 10 reps;Green   Rows 10 reps;Green   Neck Exercises: Seated   Shoulder Rolls Backwards;15 reps   Shoulder Rolls Limitations up, back and down   Neck Exercises: Prone   Neck Retraction 5 reps;5 secs   W Back Weights (lbs) 10 reps rows   Shoulder Extension 10 reps   Upper Extremity Flexion with Stabilization 5 reps   Manual Therapy   Manual Therapy Soft tissue mobilization;Myofascial release   Manual  therapy comments prone lying   Soft tissue mobilization Mid traps, rhomboids and scapular musculature                  PT Short Term Goals - 03/21/15 1532    PT SHORT TERM GOAL #1   Title By 2nd week pt will demonstrate independence in starter HEP reporting good compliance at home without exacerbation of symptoms.    PT SHORT TERM GOAL #2   Title By end of thrid week, pt wil be able to decribe three ways to manage flare-up of symptoms at home to improve sself efficacy in pain management.    PT SHORT TERM GOAL #3   Title By week 4, pt will improve left cervical rotation to >55* without exacerbation of symptoms to improve functional use of haed turns in ADL, IADL.    PT SHORT TERM GOAL #4   Title By week four pt will improve bilat cervical sidebendign to ~35 degrees without exacerbation of symptoms to improve tolerance to multiple sleeping postiions overnight.             PT Long Term Goals - 03/21/15 1536    PT LONG TERM GOAL #1   Title By 8th week pt will demonstrate independence in advanced HEP reporting good compliance  at home without exacerbation of symptoms to drive continued progress after DC.    PT LONG TERM GOAL #2   Title By week 8, pt will improve left cervical rotation to >65* without exacerbation of symptoms to improve functional use of haed turns in ADL, IADL.    PT LONG TERM GOAL #3   Title By week four pt will improve bilat cervical sidebending to ~40 degrees without exacerbation of symptoms to improve tolerance to multiple sleeping postiions overnight.                 Plan - 04/18/15 1638    Clinical Impression Statement Continued focus on improving spinal mobility and decreasing pain.  Pt always has high pain rating, yet able to complete all actvities without complaints.  Pt also complains of all over pain with several issues going on at this time.  Progressed scapular stability wtih addition of prone exericses.  Pt able to complete all without pain,  however minimal motion completed with UE flexion due to weakness.  Multiple spasms in bilateral rhomboid region and in Lt upper trap but able to resolve with manual techniques.  Onlly reduced pain to 6/10 at end of session.   PT Next Visit Plan Continue focus on improving posture with spinal flexion for thoracic and lumbar region, pec stretches and manual to cervical and thoracic musculature. Re-evaluate next session        Problem List There are no active problems to display for this patient.   Melissa Meyers, PTA/CLT [phone]  04/18/2015, 4:44 PM  [facility] [address] [place], [place], [address] Phone: [phone]   Fax:  [phone]  Name: Melissa Meyers MRN: [id number] Date of Birth: [birth year]

## 2015-04-18 NOTE — Telephone Encounter (Signed)
Called 03/28/2015 to see if pt able to come at 03/29/2015 2:30 rather than 5:30  Becky SaxCasey Symeon Puleo, LPTA; CBIS [phone]

## 2015-04-20 ENCOUNTER — Ambulatory Visit (HOSPITAL_COMMUNITY): Payer: 59

## 2015-04-24 ENCOUNTER — Ambulatory Visit (HOSPITAL_COMMUNITY): Payer: 59

## 2015-04-24 DIAGNOSIS — M47812 Spondylosis without myelopathy or radiculopathy, cervical region: Secondary | ICD-10-CM

## 2015-04-24 DIAGNOSIS — G8929 Other chronic pain: Secondary | ICD-10-CM

## 2015-04-24 DIAGNOSIS — M25612 Stiffness of left shoulder, not elsewhere classified: Secondary | ICD-10-CM

## 2015-04-24 DIAGNOSIS — M898X1 Other specified disorders of bone, shoulder: Secondary | ICD-10-CM

## 2015-04-24 DIAGNOSIS — M6289 Other specified disorders of muscle: Secondary | ICD-10-CM

## 2015-04-24 DIAGNOSIS — R202 Paresthesia of skin: Secondary | ICD-10-CM

## 2015-04-24 DIAGNOSIS — M629 Disorder of muscle, unspecified: Secondary | ICD-10-CM

## 2015-04-24 DIAGNOSIS — R200 Anesthesia of skin: Secondary | ICD-10-CM

## 2015-04-24 DIAGNOSIS — M542 Cervicalgia: Secondary | ICD-10-CM

## 2015-04-24 DIAGNOSIS — M436 Torticollis: Secondary | ICD-10-CM

## 2015-04-24 NOTE — Therapy (Addendum)
[place], [place], [address] Phone: [phone]   Fax:  [phone]  Physical Therapy Treatment  Patient Details  Name: Melissa Meyers MRN: [id number] Date of Birth: 1965/06/20 Referring Provider: Arther Abbott   Encounter Date: 04/24/2015      PT End of Session - 04/25/15 0814    Visit Number 9   Number of Visits 16   Date for PT Re-Evaluation 04/24/15   Authorization Type UHC   Authorization Time Period 03/20/15-05/20/15   Authorization - Visit Number 9   Authorization - Number of Visits 23   PT Start Time 0926   PT Stop Time 1020   PT Time Calculation (min) 54 min   Activity Tolerance Patient tolerated treatment well   Behavior During Therapy [facility] for tasks assessed/performed      Past Medical History  Diagnosis Date  . Acid reflux   . Sinus drainage   . Thyroid disease     Past Surgical History  Procedure Laterality Date  . Tubal ligation      There were no vitals filed for this visit.  Visit Diagnosis:  Cervical spondylosis without myelopathy  Chronic neck pain  Numbness and tingling in left upper extremity  Chronic scapular pain  Neck stiffness  Tight fascia  Stiffness of left shoulder joint      Subjective Assessment - 04/24/15 0930    Subjective Pt reporting that she has had better days euphemistically. Over the weekend, pt reports that she had more trouble with lower back, which is intermittently painful. L upper trap continues to feel quite sore and aggravated. Speaking to overall progress, pt reports that she nottices some improved range without paresthesia in the arm, but it is still definitely still a factor, as well as in both of the legs in the morning.     Pertinent History Pt reports began two months ago. 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    Limitations Sitting   How long can you  sit comfortably? no problem related to this CC.    How long can you stand comfortably? about 20 minutes with other limiting factors; no change since evaluation, but more related to lower back and sciatic issues.    Diagnostic tests Imaging if CSpine reveals bony arhtirtic changes and bone spurs, per pt report.    Patient Stated Goals decrease pain, return to active lifestyle and running; has started to hike the trails more recently, with moderate success.    Currently in Pain? Yes   Pain Score 8    Pain Location --  Left upper trap   Pain Orientation Left   Pain Descriptors / Indicators Burning;Aching   Pain Type Chronic pain   Pain Onset Other (comment)   Pain Frequency Constant   Aggravating Factors  Reading with forward head posture, and concenrtration; using LLE to carry heavy loads with ADL, IADL    Pain Relieving Factors Stretching, 800mg  ibuprophen 1x daily as needed; (ice does not help), heat not tried.    Multiple Pain Sites Yes  LLE, neck, LUE         *Initial Evaluation finding in blue text with reassessment findings in black.   Posture: Pt previously presenting with very erect posture, scapular retraction, lower thoracic lordosis, and sharp s-curve transition between C6-T3.  Pt does not voluntarily move head during gait, transfers, or conversation.   At reassessment  on  11/15 still limited, however sitting postur is more neutral now in the lumbar and thoracic spine, with continued forward head posture (mastoid approximately 2 inches anterior the humeral head) and thoracocervical kyphosis (C5-T3).  Strength: MMT screening of strength in BUE reveals 5/5 strength throughout with exception to the following: L shoulder elevation (UT): 4+/5   5/5 at reassessment 11/15 Left shoulder Abduction: 4+/5, mildly painful   5/5 at reassessment 11/15 Left elbow flexion: 4+/5, aggravation of subscapular burning pain 5/5 at reassessment 11/15, subscapular burning (mildly decreased)  Left  elbow extension: 4+/5, aggravation of subscapular burning pain 5/5 at reassessment 11/15, subscapular burning (mildly decreased)  Left wrist extension: 4/5, painful 5/5 at reassessment 11/15, mildly symptomatic  Left thumb extension: 3/5; 4+/5 pain free at reassessment 11/15 L digital abduction: 3/5; 5/5 pain free at reassessment 11/15   ROM, Joint Mobility: Upon attempted toe touch, apparent loss of all spinal curvature from sacrum to T5, painful and slow.   At reassessment on  11/15, mild improvement in lumbar flexion ROM but still limited to neutral during forward flexion, still stuck in very mild lordosis, with improvements since eval, index fingers together 19cm from floor.  Cervical flexion:  73 degrees; less than 2 fingers chin to sternum (WNL) on 11/15. Cervical extension: 23 degrees 35 degrees to gravity 04/24/15 R cervical side-bending: 25 degrees, pulling sensation in L upper trap 33 degrees, same sensation as before on 11/15. L cervical side-bending: 22 degrees, increased LUE numbness/tingling (total limb) 30 degrees, with joint cavitation on 11/15. R cervical rotation: 70 degrees, pain in LUE R cervical ROT 59 on 04/24/15 (likely recorded on incorrect sides at evaluation)  L cervical rotation: 40 degrees, pain in LUE L cervical ROT 68 on 04/24/15 (likely recorded on incorrect sides at evaluation)  Sensation: Appears intact throughout, no changes, still intact  Coordination: Appears intact throughout. Not addressed at reassessment.    Tone, Spasticity: Not tested. Not addressed at reassessment.   Functional Mobility: Difficulty with sleeping positions, head turns while driving, ability to use LUE for ADL, IADL. On 04/24/15, still reporting some difficulty, without surprise given only modest improvement in cervical ROM; will continue to maintain as a functional component of goals.     Special Tests: Spurling's Compression Test I/II: both positive; manual distraction x 60 seconds:  relief of LUE symptoms. Not addressed at reassessment; no anticipated change.   The patient is positive in 3 of 4 special tests to meet the statistically validated clinical prediction rule for Cervical Spine Radiculopathy which include 1. Spurling's compression, 2. Cervical distraction, & 3. Cervical rotation on the affected side of less than 60 degrees. The fourth test (upper limb neural tension test) was not attempted. 3 of 4 in this CPR reveal a a likelihood ration of 6.1.                [facility] Adult PT Treatment/Exercise - 04/25/15 0001    Manual Therapy   Soft tissue mobilization L Pecs major/minor stretch with scapular depression 3 minutes   Myofascial Release 8 minutes to Upper Traps on L                 PT Education - 04/25/15 0813    Education provided Yes   Education Details explained progress made toward goals up to this point and described how treatment focus will change slightly moving forward.    Person(s) Educated Patient   Methods Explanation;Demonstration   Comprehension Verbalized understanding  PT Short Term Goals - 04/24/15 1006    PT SHORT TERM GOAL #1   Title By 2nd week pt will demonstrate independence in starter HEP reporting good compliance at home without exacerbation of symptoms.    Status Achieved   PT SHORT TERM GOAL #2   Title By end of thrid week, pt wil be able to decribe three ways to manage flare-up of symptoms at home to improve self efficacy in pain management.    Status Achieved   PT SHORT TERM GOAL #3   Title By week 4, pt will improve Right cervical rotation to >55* without exacerbation of symptoms to improve functional use of haed turns in ADL, IADL.    Baseline Patient at 59 degrees on 11/15   Status Achieved   PT SHORT TERM GOAL #4   Title By week four pt will improve bilat cervical sidebending to ~35 degrees without exacerbation of symptoms to improve tolerance to multiple sleeping postiions overnight.     Status  On-going           PT Long Term Goals - 03/21/15 1536    PT LONG TERM GOAL #1   Title By 8th week pt will demonstrate independence in advanced HEP reporting good compliance at home without exacerbation of symptoms to drive continued progress after DC.    PT LONG TERM GOAL #2   Title By week 8, pt will improve left cervical rotation to >65* without exacerbation of symptoms to improve functional use of haed turns in ADL, IADL.    PT LONG TERM GOAL #3   Title By week four pt will improve bilat cervical sidebending to ~40 degrees without exacerbation of symptoms to improve tolerance to multiple sleeping postiions overnight.                 Plan - 04/25/15 0815    Clinical Impression Statement Reassessment performed today. Pt is demonstrating good progress toward most of short term goals, and continues to move toward achievement of long term goals. Progress includes mild to moderate improvements in cervical spine ROM, decreased severeity and frequency of LUE paresthesia episodes, improved distal  LUE strength in  C7, C8, T1 myotomes, with decreased aggravation with strength testing from C5-C6. Moving forward treatments will continue to address postural abnormalities and rigid posturing at end range spinal curvature in lumbar, thoracic, and cervical regions. Continued soft tissue work toward the L upper traps will also remain a large focus as hypertonia on the L side is strongly suspected to be contributing to ROM deficits, excessive spinal compression, and abberant neurodynamics of the brachial plexus on L.     Pt will benefit from skilled therapeutic intervention in order to improve on the following deficits Decreased activity tolerance;Decreased range of motion;Decreased mobility;Decreased strength;Impaired flexibility;Improper body mechanics;Postural dysfunction;Pain   Rehab Potential Good   Clinical Impairments Affecting Rehab Potential Chronicity of current complaint, fear avoidance  behaviors.    PT Frequency 2x / week   PT Duration 4 weeks   PT Treatment/Interventions ADLs/Self Care Home Management;Manual techniques;Therapeutic activities;Functional mobility training;Therapeutic exercise;Passive range of motion;Moist Heat;Traction;Patient/family education   PT Next Visit Plan Inititate lumbar mobility program including SKTC, DKTC rocking, and rotational AROM. Continue to release L upper traps, and assess other potential limitors of neurodynamics including the L scalenes and L pecs major and minor. Pt is a good candidate for upper extemity neuro dynamic testing to R/O isolated nerve entrapment v brachial plexopathy. granted history of symptoms of paresthesis throughout the  entire L upper limb. Continue to address forward head posture, and iniitiate core strengthening progression.    PT Home Exercise Plan Find a L upper trap stretch that is non-provocative to symptoms.    Consulted and Agree with Plan of Care Patient        Problem List There are no active problems to display for this patient.   Buccola,Allan C 04/25/2015, 12:24 PM  12:24 PM  Etta Grandchild, PT, DPT [place] License # [id number]       [facility] [address] [place], [place], [address] Phone: [phone]   Fax:  [phone]  Name: CYBELE MAULE MRN: [id number] Date of Birth: 1965-10-25   .PHYSICAL THERAPY DISCHARGE SUMMARY  Visits from Start of Care: 9  Current functional level related to goals / functional outcomes: *see above   Remaining deficits: *see above   Education / Equipment: *see above Plan: Patient agrees to discharge.  Patient goals were not met. Patient is being discharged due to not returning since the last visit.  ?????   4:04 PM, 08/01/2015 Etta Grandchild, PT, DPT PRN Physical Therapist at [facility] # [id number] [phone] (office)

## 2015-04-25 ENCOUNTER — Telehealth (HOSPITAL_COMMUNITY): Payer: Self-pay

## 2015-04-25 ENCOUNTER — Ambulatory Visit (HOSPITAL_COMMUNITY): Payer: 59

## 2015-04-25 NOTE — Telephone Encounter (Signed)
No show, called and left message informing [name] of missed apt, contact info given to call and make further apts.  [address]y Meyers, LPTA; CBIS [phone]

## 2015-04-26 ENCOUNTER — Ambulatory Visit: Payer: 59 | Admitting: Orthopedic Surgery
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
## 2015-09-12 ENCOUNTER — Ambulatory Visit (HOSPITAL_COMMUNITY)
Admission: RE | Admit: 2015-09-12 | Discharge: 2015-09-12 | Disposition: A | Discharge status: Home / Self Care | Payer: 59 | Source: Ambulatory Visit | Attending: Pulmonary Disease | Admitting: Pulmonary Disease

## 2015-09-12 DIAGNOSIS — R601 Generalized edema: Secondary | ICD-10-CM

## 2015-09-12 DIAGNOSIS — R609 Edema, unspecified: Secondary | ICD-10-CM | POA: Diagnosis not present

## 2015-09-12 DIAGNOSIS — I059 Rheumatic mitral valve disease, unspecified: Secondary | ICD-10-CM | POA: Diagnosis not present

## 2015-09-12 DIAGNOSIS — I358 Other nonrheumatic aortic valve disorders: Secondary | ICD-10-CM | POA: Diagnosis not present

## 2020-05-22 ENCOUNTER — Other Ambulatory Visit: Payer: Self-pay

## 2020-05-22 ENCOUNTER — Ambulatory Visit (HOSPITAL_COMMUNITY): Payer: No Typology Code available for payment source | Attending: Internal Medicine

## 2020-05-22 ENCOUNTER — Encounter (HOSPITAL_COMMUNITY): Payer: Self-pay

## 2020-05-22 DIAGNOSIS — M79642 Pain in left hand: Secondary | ICD-10-CM | POA: Insufficient documentation

## 2020-05-22 DIAGNOSIS — R29898 Other symptoms and signs involving the musculoskeletal system: Secondary | ICD-10-CM | POA: Insufficient documentation

## 2020-05-22 DIAGNOSIS — M25642 Stiffness of left hand, not elsewhere classified: Secondary | ICD-10-CM | POA: Insufficient documentation

## 2020-05-22 DIAGNOSIS — R278 Other lack of coordination: Secondary | ICD-10-CM | POA: Diagnosis present

## 2020-05-23 NOTE — Therapy (Signed)
[facility] [address] [place], [place], [address] Phone: [phone]   Fax:  [phone]  Occupational Therapy Evaluation  Patient Details  Name: Melissa Meyers MRN: [id number] Date of Birth: 1966-06-09 Referring Provider (OT): Gertha Calkin, MD   Encounter Date: 05/22/2020   OT End of Session - 05/23/20 1805    Visit Number 1    Number of Visits 24    Date for OT Re-Evaluation 08/14/20    Authorization Type [facility] has authorized 15 visits at this time.    Authorization - Visit Number 1    Authorization - Number of Visits 15    OT Start Time 1537    OT Stop Time 1700    OT Time Calculation (min) 83 min    Activity Tolerance Patient tolerated treatment well    Behavior During Therapy WFL for tasks assessed/performed           Past Medical History:  Diagnosis Date  . Acid reflux   . Sinus drainage   . Thyroid disease     Past Surgical History:  Procedure Laterality Date  . TUBAL LIGATION      There were no vitals filed for this visit.   Subjective Assessment - 05/22/20 1548    Subjective  S: It feels tight in the creases because of the incision.    Pertinent History Patient is a 54 y/o female S/P left index flexor tendon repair (FDS,FDP, Zone II) which was completed on 04/26/20. Pt reports she cut her hand when trying to cut a zip tie. Sutures have been removed at last MD appointment. A dorsal blocking splint was fabricated at the [place]. Dr Lavera Guise has referred patient to occupational therapy for evaluation and treatment.    Patient Stated Goals To regain use of her hand.    Currently in Pain? Yes    Pain Score 4     Pain Location Hand   incision area (palm creases)   Pain Orientation Left    Pain Descriptors / Indicators Other (Comment);Tightness;Tingling   pulling   Pain Type Surgical pain    Pain Radiating Towards N/A    Pain Onset 1 to 4 weeks ago    Pain Frequency Occasional    Aggravating Factors  passive stretches, hard  portion of the splint on palm    Pain Relieving Factors pain medication, elevate extremity    Effect of Pain on Daily Activities Pt is unable to utilize Left hand for any activity.    Multiple Pain Sites No             [facility] OT Assessment - 05/22/20 1554      Assessment   Medical Diagnosis left index finger flexor tendon repair    Referring Provider (OT) Gertha Calkin, MD    Onset Date/Surgical Date 04/26/20    Hand Dominance Right    Next MD Visit --   January 2022   Prior Therapy Yes. Patient has been to OP therapy for neck pain, rotator cuff pain.      Precautions   Precautions Other (comment)    Precaution Comments follow protocol for flexor tendon repair via [place] hand protocol book.    Required Braces or Orthoses Other Brace/Splint    Other Brace/Splint dorsal blocking splint.      Restrictions   Weight Bearing Restrictions Yes    LUE Weight Bearing Non weight bearing      Balance Screen   Has the patient fallen in the  past 6 months No      Home  Environment   Family/patient expects to be discharged to: Private residence      Prior Function   Level of Independence Independent    Vocation Unemployed      ADL   ADL comments Pt is unable to utilize left hand for any daily tasks.      Mobility   Mobility Status Independent      Vision - History   Baseline Vision No visual deficits      Cognition   Overall Cognitive Status Within Functional Limits for tasks assessed      Observation/Other Assessments   Skin Integrity Partial healed incision noted. 3.5 cm Palm to lateral aspect of 5th digit base. 3.0 cm MCP joint to medial PIP joint. Open wound area present at base of incision with small amount of white drainage noted on gauze.    Focus on Therapeutic Outcomes (FOTO)  N/A      Coordination   Gross Motor Movements are Fluid and Coordinated No    Fine Motor Movements are Fluid and Coordinated No    9 Hole Peg Test Left;Right    Right 9 Hole Peg Test 20.2"     Left 9 Hole Peg Test 41.6"   therapist providing peg set up     Edema   Edema Edema noted in left hand. Will measure at next session.      ROM / Strength   AROM / PROM / Strength AROM      Palpation   Palpation comment Patient presents with fascial restrictions and scar tissue along incision and volar aspect of hand.      AROM   Overall AROM Comments Assessed seated.    AROM Assessment Site Wrist    Right/Left Wrist Left    Left Wrist Extension 40 Degrees    Left Wrist Flexion 42 Degrees      Left Hand AROM   L Thumb MCP 0-60 50 Degrees    L Thumb IP 0-80 54 Degrees    L Index  MCP 0-90 64 Degrees    L Index PIP 0-100 46 Degrees    L Index DIP 0-70 0 Degrees    L Long  MCP 0-90 52 Degrees    L Long PIP 0-100 68 Degrees    L Long DIP 0-70 22 Degrees    L Ring  MCP 0-90 46 Degrees    L Ring PIP 0-100 74 Degrees    L Ring DIP 0-70 28 Degrees    L Little  MCP 0-90 58 Degrees    L Little PIP 0-100 86 Degrees    L Little DIP 0-70 64 Degrees               Quick Dash - 05/22/20 1559    Open a tight or new jar Unable    Do heavy household chores (wash walls, wash floors) Unable    Carry a shopping bag or briefcase Unable    Wash your back Unable    Use a knife to cut food Unable    Recreational activities in which you take some force or impact through your arm, shoulder, or hand (golf, hammering, tennis) Unable    During the past week, to what extent has your arm, shoulder or hand problem interfered with your normal social activities with family, friends, neighbors, or groups? Not at all    During the past week, to what extent has your arm, shoulder  or hand problem limited your work or other regular daily activities Extremely    Arm, shoulder, or hand pain. Extreme    Tingling (pins and needles) in your arm, shoulder, or hand Moderate    Difficulty Sleeping Moderate difficulty    DASH Score 81.82 %               OT Treatments/Exercises (OP) - 05/23/20 1801       ADLs   ADL Comments Per wound therapist PT, patient washed left hand with soap and water. Pat dry. Small amount of vaseline applied to open area at base of incision. 4x4 gauze applied and light gauze wrap applied with tape to secure.                 OT Education - 05/23/20 1802    Education Details Wound care education provided by PT consult: wash incison with soap and water, pat dry, use vaseline to keep moist (do not saturate), keep covered with gauze and gauze wrap. Pt able to verbalize HEP provided from [place] which is appropriate for week 3 post op. Pt to continue these exercises until first treatment in whcih she will progress to week 4 HEP.    Person(s) Educated Patient    Methods Explanation;Demonstration    Comprehension Verbalized understanding;Returned demonstration            OT Short Term Goals - 05/23/20 1811      OT SHORT TERM GOAL #1   Title Patient will be educated and independent with HEP in order to safely mobilize her left hand in order to decrease risk of contracture and increase functional ROM.    Time 6    Period Months    Status New    Target Date 07/05/20      OT SHORT TERM GOAL #2   Title Patient will improve LUE forearm, wrist, and hand P/ROM to [place] for improved functional use of LUE.    Time 6    Period Weeks    Status New      OT SHORT TERM GOAL #3   Title Patient will report a decreased pain level of 3/10 when completing HEP and mobility exercises with left UE.    Time 6    Period Weeks    Status New      OT SHORT TERM GOAL #4   Title Pt will decrease left hand fascial restrictions and increase scar mobility in order to increase ability to form a full fist.    Time 6    Period Weeks    Status New             OT Long Term Goals - 05/23/20 1813      OT LONG TERM GOAL #1   Title Patient will return to prior level of function using her LUE as dominant with daily tasks.    Time 12    Period Weeks    Status New    Target Date 08/14/20       OT LONG TERM GOAL #2   Title Patient will improve LUE A/ROM to WNL for improved ability to drive, and complete desired daily tasks at 75% or more of the time.    Time 12    Period Weeks    Status New      OT LONG TERM GOAL #3   Title Patient will improve LUE grip and pinch strength to [facility] in order to open up jars and containers.  Time 12    Period Weeks    Status New      OT LONG TERM GOAL #4   Title Patient will increase her fine motor coordination of her left hand while completing the nine hole peg test in 30" or less in the standardized fashion in order to return to utlizing her left hand for all desired coordination tasks.    Time 12    Period Weeks    Status New                 Plan - 05/23/20 1806    Clinical Impression Statement A: Patient is a 54 y/o female S/P left hand flexor tendon repair causing increased pain, fascial restrictions, edema, and decreased strength, ROM, coordination, and skin integrity resulting in extreme difficulty utilizing her left hand to complete any daily tasks.    OT Occupational Profile and History Detailed Assessment- Review of Records and additional review of physical, cognitive, psychosocial history related to current functional performance    Occupational performance deficits (Please refer to evaluation for details): ADL's;IADL's;Rest and Sleep;Leisure    Body Structure / Function / Physical Skills ADL;Strength;Pain;GMC;Dexterity;Edema;UE functional use;ROM;IADL;Fascial restriction;Scar mobility;Wound;Coordination;FMC;Skin integrity;Decreased knowledge of precautions;Flexibility    Rehab Potential Excellent    Clinical Decision Making Several treatment options, min-mod task modification necessary    Comorbidities Affecting Occupational Performance: None    Modification or Assistance to Complete Evaluation  Min-Moderate modification of tasks or assist with assess necessary to complete eval    OT Frequency 2x / week    OT Duration 12  weeks    OT Treatment/Interventions Self-care/ADL training;Moist Heat;DME and/or AE instruction;Splinting;Therapeutic activities;Compression bandaging;Ultrasound;Therapeutic exercise;Scar mobilization;Passive range of motion;Neuromuscular education;Cryotherapy;Electrical Stimulation;Paraffin;Manual Therapy;Patient/family education    Plan P: Patient will benefit from skilled OT services to increase functional use of her left hand as her nondominant extremity. Treatment plan: Ok to wash and dress wound, If debridement is needed a wound care order will need to be sent to MD. scar mobility, myofascial release, following protocol for flexor tendon repair from [place] hand protocol book.    OT Home Exercise Plan eval: continue completing [facility] by [facility]    Consulted and Agree with Plan of Care Patient           Patient will benefit from skilled therapeutic intervention in order to improve the following deficits and impairments:   Body Structure / Function / Physical Skills: ADL,Strength,Pain,GMC,Dexterity,Edema,UE functional use,ROM,IADL,Fascial restriction,Scar mobility,Wound,Coordination,FMC,Skin integrity,Decreased knowledge of precautions,Flexibility       Visit Diagnosis: Other symptoms and signs involving the musculoskeletal system - Plan: Ot plan of care cert/re-cert  Other lack of coordination - Plan: Ot plan of care cert/re-cert  Stiffness of left hand, not elsewhere classified - Plan: Ot plan of care cert/re-cert  Pain in left hand - Plan: Ot plan of care cert/re-cert    Problem List There are no problems to display for this patient.  Limmie Patricia, OTR/L,CBIS  [phone]  05/23/2020, 6:21 PM  [facility] [address] [place], [place], [address] Phone: [phone]   Fax:  [phone]  Name: Melissa Meyers MRN: [id number] Date of Birth: 09-14-1965

## 2020-05-24 ENCOUNTER — Other Ambulatory Visit: Payer: Self-pay

## 2020-05-24 ENCOUNTER — Encounter (HOSPITAL_COMMUNITY): Payer: Self-pay

## 2020-05-24 ENCOUNTER — Ambulatory Visit (HOSPITAL_COMMUNITY): Payer: No Typology Code available for payment source

## 2020-05-24 DIAGNOSIS — R29898 Other symptoms and signs involving the musculoskeletal system: Secondary | ICD-10-CM

## 2020-05-24 DIAGNOSIS — M25642 Stiffness of left hand, not elsewhere classified: Secondary | ICD-10-CM

## 2020-05-24 DIAGNOSIS — M79642 Pain in left hand: Secondary | ICD-10-CM

## 2020-05-24 DIAGNOSIS — R278 Other lack of coordination: Secondary | ICD-10-CM

## 2020-05-28 ENCOUNTER — Encounter (HOSPITAL_COMMUNITY): Payer: Self-pay

## 2020-05-28 NOTE — Therapy (Signed)
[facility] [address] [place], [place], [address] Phone: [phone]   Fax:  [phone]  Occupational Therapy Treatment  Patient Details  Name: Melissa Meyers MRN: [id number] Date of Birth: 08-31-1965 Referring Provider (OT): Gertha Calkin, MD   Encounter Date: 05/24/2020   OT End of Session - 05/28/20 1024    Visit Number 2    Number of Visits 24    Date for OT Re-Evaluation 08/14/20    Authorization Type VA has authorized 15 visits at this time.    Authorization - Visit Number 2    Authorization - Number of Visits 15    OT Start Time 1650    OT Stop Time 1730    OT Time Calculation (min) 40 min    Activity Tolerance Patient tolerated treatment well    Behavior During Therapy WFL for tasks assessed/performed           Past Medical History:  Diagnosis Date  . Acid reflux   . Sinus drainage   . Thyroid disease     Past Surgical History:  Procedure Laterality Date  . TUBAL LIGATION      There were no vitals filed for this visit.   Subjective Assessment - 05/28/20 0950    Currently in Pain? Yes    Pain Score 3     Pain Location Hand   incision area (palm creases)   Pain Orientation Left    Pain Descriptors / Indicators Tightness;Tingling;Other (Comment)   pulling   Pain Type Surgical pain    Pain Onset 1 to 4 weeks ago    Pain Frequency Occasional    Aggravating Factors  passive stretching, movement    Pain Relieving Factors pain medication, elevate extremity    Effect of Pain on Daily Activities unable to utlize the left hand for any activity    Multiple Pain Sites No              [facility] OT Assessment - 05/28/20 0955      Assessment   Medical Diagnosis left index finger flexor tendon repair      Precautions   Precautions Other (comment)    Precaution Comments follow protocol for flexor tendon repair via [place] hand protocol book. Week 4: able to complete A/ROM wrist flexion/extension, composite digit  flexion/extension.    Required Braces or Orthoses Other Brace/Splint    Other Brace/Splint dorsal blocking splint.                  Wound Therapy - 05/28/20 0950    Dressing  Patient cleaned hands at sink using soap and water while using washcloth to gently loosen any additional loose eschar present. At end of session, small amount of vaseline was applied with cotton tipped applicator, 4x4 gauze pad placed over. Wrapped with gauze and then tape applied to secure.            OT Treatments/Exercises (OP) - 05/28/20 8341      Exercises   Exercises Hand;Wrist      Wrist Exercises   Other wrist exercises A/ROM wrist flexion/extension with 3-5 " hold at end of each movement. 10X      Hand Exercises   Other Hand Exercises Following HEP, each set of exercises were held for 3-5" at the end of each movement. 10X each: 1) Composite digit flexion (fist) into composite digit extension. 2) Passive ROM composite finger flexion into passive hook fist (MCP extension), let go and attempt to  hold positioning for 5 seconds. 3) Active movement for the following: composite finger flexion (into fist) transition to hook grip then gently straighten (composite finger extension).      Manual Therapy   Manual Therapy Soft tissue mobilization;Myofascial release    Manual therapy comments Manual therapy completed prior to exercises.    Soft tissue mobilization Soft tissue mobilzation completed along surgical incision to decrease scar tissue and increase scar mobility.    Myofascial Release Myofascial release completed to left wrist, forearm proximal to wrist, dorsal and volar aspect of hand, and all digits to decrease fascial restrictions and increase joint mobility in a pain free zone.                  OT Education - 05/28/20 1021    Education Details Via [place] protocol patient handout booklet: Patient was provided with week 4 postop [place] passive motion program HEP ( see media tab). Reviewed  each exercise during session while reviewing any additional changes during this stage of protocol.    Person(s) Educated Patient    Methods Explanation;Demonstration;Verbal cues;Handout    Comprehension Returned demonstration;Verbalized understanding            OT Short Term Goals - 05/24/20 1658      OT SHORT TERM GOAL #1   Title Patient will be educated and independent with HEP in order to safely mobilize her left hand in order to decrease risk of contracture and increase functional ROM.    Time 6    Period Months    Status On-going    Target Date 07/05/20      OT SHORT TERM GOAL #2   Title Patient will improve LUE forearm, wrist, and hand P/ROM to [facility] for improved functional use of LUE.    Time 6    Period Weeks    Status On-going      OT SHORT TERM GOAL #3   Title Patient will report a decreased pain level of 3/10 when completing HEP and mobility exercises with left UE.    Time 6    Period Weeks    Status On-going      OT SHORT TERM GOAL #4   Title Pt will decrease left hand fascial restrictions and increase scar mobility in order to increase ability to form a full fist.    Time 6    Period Weeks    Status On-going             OT Long Term Goals - 05/24/20 1658      OT LONG TERM GOAL #1   Title Patient will return to prior level of function using her LUE as dominant with daily tasks.    Time 12    Period Weeks    Status On-going      OT LONG TERM GOAL #2   Title Patient will improve LUE A/ROM to WNL for improved ability to drive, and complete desired daily tasks at 75% or more of the time.    Time 12    Period Weeks    Status On-going      OT LONG TERM GOAL #3   Title Patient will improve LUE grip and pinch strength to [facility] in order to open up jars and containers.    Time 12    Period Weeks    Status On-going      OT LONG TERM GOAL #4   Title Patient will increase her fine motor coordination of her left hand while  completing the nine hole peg test in  30" or less in the standardized fashion in order to return to utlizing her left hand for all desired coordination tasks.    Time 12    Period Weeks    Status On-going                 Plan - 05/28/20 1025    Clinical Impression Statement A: Patient presents today with reports of large scab falling off the base of her incision. Open part of wound is showing signs of closure. Patient has been completing her HEP provided by the [place] without any issues. Progressed to week 4 postop HEP via [place] hand and shoulder protocol book. Reviewed all exercises of HEP and education provided regarding this step of progression. Patient continues to be limited with composite digit flexion due to scar tissue, decrease scar mobility and edema present. VC for form and technique were provided.    Body Structure / Function / Physical Skills ADL;Strength;Pain;GMC;Dexterity;Edema;UE functional use;ROM;IADL;Fascial restriction;Scar mobility;Wound;Coordination;FMC;Skin integrity;Decreased knowledge of precautions;Flexibility    Plan P: Follow up on HEP. Continue with myofascial release and soft tissue mobilization along incision. Assess wound healing and continue to provide cleaning and dressing change at end of session. Continue with gentle passive and acitve ROM of the wrist and hand.    OT Home Exercise Plan eval: continue completing [facility]P established by [facility]12/16: week 4 postop HEP provided.    Consulted and Agree with Plan of Care Patient           Patient will benefit from skilled therapeutic intervention in order to improve the following deficits and impairments:   Body Structure / Function / Physical Skills: ADL,Strength,Pain,GMC,Dexterity,Edema,UE functional use,ROM,IADL,Fascial restriction,Scar mobility,Wound,Coordination,FMC,Skin integrity,Decreased knowledge of precautions,Flexibility       Visit Diagnosis: Other lack of coordination  Stiffness of left hand, not elsewhere classified  Pain in left  hand  Other symptoms and signs involving the musculoskeletal system    Problem List There are no problems to display for this patient.  Limmie Patricia, OTR/L,CBIS  [phone]  05/28/2020, 11:39 AM  [facility] [address] [place], [place], [address] Phone: [phone]   Fax:  [phone]  Name: Melissa Meyers MRN: [id number] Date of Birth: 1965-09-25

## 2020-05-28 NOTE — Patient Instructions (Signed)
See media tab for photos of Week 4 postop HEP provided this date.

## 2020-05-30 ENCOUNTER — Encounter (HOSPITAL_COMMUNITY): Payer: Self-pay

## 2020-05-30 ENCOUNTER — Ambulatory Visit (HOSPITAL_COMMUNITY): Payer: No Typology Code available for payment source

## 2020-05-30 ENCOUNTER — Encounter (HOSPITAL_COMMUNITY): Payer: No Typology Code available for payment source | Admitting: Occupational Therapy

## 2020-05-30 ENCOUNTER — Other Ambulatory Visit: Payer: Self-pay

## 2020-05-30 DIAGNOSIS — R29898 Other symptoms and signs involving the musculoskeletal system: Secondary | ICD-10-CM

## 2020-05-30 DIAGNOSIS — M25642 Stiffness of left hand, not elsewhere classified: Secondary | ICD-10-CM

## 2020-05-30 DIAGNOSIS — R278 Other lack of coordination: Secondary | ICD-10-CM

## 2020-05-30 DIAGNOSIS — M79642 Pain in left hand: Secondary | ICD-10-CM

## 2020-05-30 NOTE — Therapy (Signed)
[facility] [address] [place], [place], [address] Phone: [phone]   Fax:  [phone]  Occupational Therapy Treatment  Patient Details  Name: Melissa Meyers MRN: [id number] Date of Birth: 07-Nov-1965 Referring Provider (OT): Gertha Calkin, MD   Encounter Date: 05/30/2020   OT End of Session - 05/30/20 1554    Visit Number 3    Number of Visits 24    Date for OT Re-Evaluation 08/14/20    Authorization Type [facility] has authorized 15 visits at this time.    Authorization - Visit Number 3    Authorization - Number of Visits 15    OT Start Time 1430    OT Stop Time 1514    OT Time Calculation (min) 44 min    Activity Tolerance Patient tolerated treatment well    Behavior During Therapy WFL for tasks assessed/performed           Past Medical History:  Diagnosis Date  . Acid reflux   . Sinus drainage   . Thyroid disease     Past Surgical History:  Procedure Laterality Date  . TUBAL LIGATION      There were no vitals filed for this visit.   Subjective Assessment - 05/30/20 1501    Subjective  S: It isn't painful when I'm moving it. It just has this feeling like it's weak and it hasn't been moved in a long time.    Currently in Pain? No/denies   no pain at rest.             [facility] OT Assessment - 05/30/20 1543      Assessment   Medical Diagnosis left index finger flexor tendon repair      Precautions   Precautions Other (comment)    Precaution Comments follow protocol for flexor tendon repair via [place] hand protocol book. Week 4: able to complete A/ROM wrist flexion/extension, composite digit flexion/extension.    Required Braces or Orthoses Other Brace/Splint    Other Brace/Splint dorsal blocking splint.                  Wound Therapy - 05/30/20 1544    Dressing  Patient washed hands at sink with soap and water while using washcloth to loosen any additional eschar present. At end of session, wound was  dressed. Small amount of vaseline was applied with cotton tip applicator, one 2x2 gauze pad placed with thin layer of gauze wrapped applied. One piece of medipore tape placed to secure.            OT Treatments/Exercises (OP) - 05/30/20 1543      Exercises   Exercises Hand;Wrist      Wrist Exercises   Other wrist exercises A/ROM wrist flexion/extension with 3-5 " hold at end of each movement. 10X      Hand Exercises   Other Hand Exercises Composite digit flexion and extension completed with 2-3 second hold at end of stretch    Other Hand Exercises Thumb flexion/extension A/ROM 10X. Thumb A/ROM adduction/abduction      Modalities   Modalities Cryotherapy      Cryotherapy   Number Minutes Cryotherapy 3 Minutes    Cryotherapy Location Hand   along 5th digt distal to proximal end, palmar digital crease, and distal palmar crease   Type of Cryotherapy Ice massage      Manual Therapy   Manual Therapy Soft tissue mobilization;Myofascial release    Manual therapy comments Manual therapy completed prior  to exercises.    Soft tissue mobilization Soft tissue mobilzation completed along surgical incision to decrease scar tissue and increase scar mobility.    Myofascial Release Myofascial release and manual gentle stretching completed to dorsal and volar aspect of left hand, and all digits to decrease fascial restrictions and increase joint mobility in a pain free zone.                  OT Education - 05/30/20 1553    Education Details Provided education on performing ice massage at home for 3 minutes with demonstration of technique to help decrease edema.    Person(s) Educated Patient    Methods Explanation;Demonstration    Comprehension Verbalized understanding            OT Short Term Goals - 05/24/20 1658      OT SHORT TERM GOAL #1   Title Patient will be educated and independent with HEP in order to safely mobilize her left hand in order to decrease risk of contracture  and increase functional ROM.    Time 6    Period Months    Status On-going    Target Date 07/05/20      OT SHORT TERM GOAL #2   Title Patient will improve LUE forearm, wrist, and hand P/ROM to [facility] for improved functional use of LUE.    Time 6    Period Weeks    Status On-going      OT SHORT TERM GOAL #3   Title Patient will report a decreased pain level of 3/10 when completing HEP and mobility exercises with left UE.    Time 6    Period Weeks    Status On-going      OT SHORT TERM GOAL #4   Title Pt will decrease left hand fascial restrictions and increase scar mobility in order to increase ability to form a full fist.    Time 6    Period Weeks    Status On-going             OT Long Term Goals - 05/24/20 1658      OT LONG TERM GOAL #1   Title Patient will return to prior level of function using her LUE as dominant with daily tasks.    Time 12    Period Weeks    Status On-going      OT LONG TERM GOAL #2   Title Patient will improve LUE A/ROM to WNL for improved ability to drive, and complete desired daily tasks at 75% or more of the time.    Time 12    Period Weeks    Status On-going      OT LONG TERM GOAL #3   Title Patient will improve LUE grip and pinch strength to [facility] in order to open up jars and containers.    Time 12    Period Weeks    Status On-going      OT LONG TERM GOAL #4   Title Patient will increase her fine motor coordination of her left hand while completing the nine hole peg test in 30" or less in the standardized fashion in order to return to utlizing her left hand for all desired coordination tasks.    Time 12    Period Weeks    Status On-going                 Plan - 05/30/20 1554    Clinical Impression Statement A: Wound/incision shows  massive improvement this date. No eschar present for 90% of incision. Small open area still present at base of incision although smaller than last session. Patient provided with addtional sockettes to  replace current one which shows signs of wear and dirt. Manual techniques completed to palm along incision to decrease fascial restrictions. Patient was able to form a fist with tips of fingers touching palm during P/ROM. VC for form and technique were provided. Ice massage was completed at start of session to decrease swelling with education provided on technique for patient to do at home.    Body Structure / Function / Physical Skills ADL;Strength;Pain;GMC;Dexterity;Edema;UE functional use;ROM;IADL;Fascial restriction;Scar mobility;Wound;Coordination;FMC;Skin integrity;Decreased knowledge of precautions;Flexibility    Plan P: Continue to follow protocol for week 4 postop. Continue with myofascial release and soft tissue mobilization to incision and palm. Continue to assess wound healing to provide cleaning and dressing change at end of session. Continue with gentle passive and active ROM of the wrist and hand.    Consulted and Agree with Plan of Care Patient           Patient will benefit from skilled therapeutic intervention in order to improve the following deficits and impairments:   Body Structure / Function / Physical Skills: ADL,Strength,Pain,GMC,Dexterity,Edema,UE functional use,ROM,IADL,Fascial restriction,Scar mobility,Wound,Coordination,FMC,Skin integrity,Decreased knowledge of precautions,Flexibility       Visit Diagnosis: Other lack of coordination  Stiffness of left hand, not elsewhere classified  Pain in left hand  Other symptoms and signs involving the musculoskeletal system    Problem List There are no problems to display for this patient.  Limmie Patricia, OTR/L,CBIS  [phone]  05/30/2020, 4:00 PM  [facility] [address] [place], [place], [address] Phone: [phone]   Fax:  [phone]  Name: Melissa Meyers MRN: [id number] Date of Birth: December 04, 1965

## 2020-05-31 ENCOUNTER — Ambulatory Visit (HOSPITAL_COMMUNITY): Payer: No Typology Code available for payment source | Admitting: Occupational Therapy

## 2020-05-31 ENCOUNTER — Encounter (HOSPITAL_COMMUNITY): Payer: Self-pay | Admitting: Occupational Therapy

## 2020-05-31 DIAGNOSIS — R29898 Other symptoms and signs involving the musculoskeletal system: Secondary | ICD-10-CM | POA: Diagnosis not present

## 2020-05-31 DIAGNOSIS — R278 Other lack of coordination: Secondary | ICD-10-CM

## 2020-05-31 DIAGNOSIS — M25642 Stiffness of left hand, not elsewhere classified: Secondary | ICD-10-CM

## 2020-05-31 DIAGNOSIS — M79642 Pain in left hand: Secondary | ICD-10-CM

## 2020-05-31 NOTE — Therapy (Signed)
[facility] [address] [place], [place], [address] Phone: [phone]   Fax:  [phone]  Occupational Therapy Treatment  Patient Details  Name: Melissa Meyers MRN: [id number] Date of Birth: 1965/08/08 Referring Provider (OT): Gertha Calkin, MD   Encounter Date: 05/31/2020   OT End of Session - 05/31/20 1034    Visit Number 4    Number of Visits 24    Date for OT Re-Evaluation 08/14/20    Authorization Type [facility] has authorized 15 visits at this time.    Authorization - Visit Number 4    Authorization - Number of Visits 15    OT Start Time [phone]    OT Stop Time 1027    OT Time Calculation (min) 38 min    Activity Tolerance Patient tolerated treatment well    Behavior During Therapy WFL for tasks assessed/performed           Past Medical History:  Diagnosis Date  . Acid reflux   . Sinus drainage   . Thyroid disease     Past Surgical History:  Procedure Laterality Date  . TUBAL LIGATION      There were no vitals filed for this visit.   Subjective Assessment - 05/31/20 0950    Subjective  S: It's a little sore today but bearable.    Currently in Pain? Yes    Pain Score 5     Pain Location Hand   incision area   Pain Orientation Left    Pain Descriptors / Indicators Sore    Pain Type Surgical pain    Pain Radiating Towards N/A    Pain Onset More than a month ago    Pain Frequency Intermittent    Aggravating Factors  movement, passive stretching    Pain Relieving Factors pain medication, elevation    Effect of Pain on Daily Activities unable to use LUE for any tasks.    Multiple Pain Sites No              [facility] OT Assessment - 05/31/20 0949      Assessment   Medical Diagnosis left index finger flexor tendon repair      Precautions   Precautions Other (comment)    Precaution Comments follow protocol for flexor tendon repair via [place] hand protocol book. Week 4: able to complete A/ROM wrist flexion/extension,  composite digit flexion/extension.    Required Braces or Orthoses Other Brace/Splint    Other Brace/Splint dorsal blocking splint.                  Wound Therapy - 05/31/20 0950    Dressing  Patient washed hands at sink with soap and water while using washcloth to loosen any additional eschar present. At end of session, wound was dressed. Small amount of vaseline was applied with cotton tip applicator, one 2x2 gauze pad placed with thin layer of gauze wrapped applied. One piece of medipore tape placed to secure.            OT Treatments/Exercises (OP) - 05/31/20 4132      Exercises   Exercises Hand;Wrist      Wrist Exercises   Other wrist exercises A/ROM wrist flexion/extension with 3-5 " hold at end of each movement. 10X      Hand Exercises   Other Hand Exercises Composite digit flexion and extension completed with 2-3 second hold at end of stretch-A/ROM    Other Hand Exercises Thumb flexion/extension A/ROM 10X. Thumb  A/ROM adduction/abduction      Manual Therapy   Manual Therapy Soft tissue mobilization;Myofascial release    Manual therapy comments Manual therapy completed prior to exercises.    Soft tissue mobilization Soft tissue mobilzation completed along surgical incision to decrease scar tissue and increase scar mobility.    Myofascial Release Myofascial release and manual gentle stretching completed to dorsal and volar aspect of left hand, and all digits to decrease fascial restrictions and increase joint mobility in a pain free zone.                    OT Short Term Goals - 05/24/20 1658      OT SHORT TERM GOAL #1   Title Patient will be educated and independent with HEP in order to safely mobilize her left hand in order to decrease risk of contracture and increase functional ROM.    Time 6    Period Months    Status On-going    Target Date 07/05/20      OT SHORT TERM GOAL #2   Title Patient will improve LUE forearm, wrist, and hand P/ROM to [facility]  for improved functional use of LUE.    Time 6    Period Weeks    Status On-going      OT SHORT TERM GOAL #3   Title Patient will report a decreased pain level of 3/10 when completing HEP and mobility exercises with left UE.    Time 6    Period Weeks    Status On-going      OT SHORT TERM GOAL #4   Title Pt will decrease left hand fascial restrictions and increase scar mobility in order to increase ability to form a full fist.    Time 6    Period Weeks    Status On-going             OT Long Term Goals - 05/24/20 1658      OT LONG TERM GOAL #1   Title Patient will return to prior level of function using her LUE as dominant with daily tasks.    Time 12    Period Weeks    Status On-going      OT LONG TERM GOAL #2   Title Patient will improve LUE A/ROM to WNL for improved ability to drive, and complete desired daily tasks at 75% or more of the time.    Time 12    Period Weeks    Status On-going      OT LONG TERM GOAL #3   Title Patient will improve LUE grip and pinch strength to [facility] in order to open up jars and containers.    Time 12    Period Weeks    Status On-going      OT LONG TERM GOAL #4   Title Patient will increase her fine motor coordination of her left hand while completing the nine hole peg test in 30" or less in the standardized fashion in order to return to utlizing her left hand for all desired coordination tasks.    Time 12    Period Weeks    Status On-going                 Plan - 05/31/20 1034    Clinical Impression Statement A: Pt with small open area at base of incision, healing well. Continued with manual techniques to palm along incision area to address fascial restrictions and edema. Continued to follow protocol  with wrist A/ROM and digit A/ROM with gentle passive stretching, pt continues to be able to touch fingertips to palm with exercises. Discussed HEP and pt demonstrates exercises with good form.    Body Structure / Function / Physical  Skills ADL;Strength;Pain;GMC;Dexterity;Edema;UE functional use;ROM;IADL;Fascial restriction;Scar mobility;Wound;Coordination;FMC;Skin integrity;Decreased knowledge of precautions;Flexibility    Plan P: Progress to week 5 postop. Continue with myofascial release and soft tissue mobilization to incision and palm. Continue to assess wound healing to provide cleaning and dressing change at end of session as needed. Continue with gentle passive and active ROM of the wrist and hand.    OT Home Exercise Plan eval: continue completing HEP established by [facility]12/16: week 4 postop HEP provided.    Consulted and Agree with Plan of Care Patient           Patient will benefit from skilled therapeutic intervention in order to improve the following deficits and impairments:   Body Structure / Function / Physical Skills: ADL,Strength,Pain,GMC,Dexterity,Edema,UE functional use,ROM,IADL,Fascial restriction,Scar mobility,Wound,Coordination,FMC,Skin integrity,Decreased knowledge of precautions,Flexibility       Visit Diagnosis: Other lack of coordination  Stiffness of left hand, not elsewhere classified  Pain in left hand  Other symptoms and signs involving the musculoskeletal system    Problem List There are no problems to display for this patient.  Ezra Sites, OTR/L  [phone] 05/31/2020, 10:37 AM  [facility] [address] [place], [place], [address] Phone: [phone]   Fax:  [phone]  Name: Melissa Meyers MRN: [id number] Date of Birth: 1966-03-09

## 2020-06-04 ENCOUNTER — Ambulatory Visit (HOSPITAL_COMMUNITY): Payer: No Typology Code available for payment source | Admitting: Occupational Therapy

## 2020-06-04 ENCOUNTER — Encounter (HOSPITAL_COMMUNITY): Payer: Self-pay | Admitting: Occupational Therapy

## 2020-06-04 ENCOUNTER — Other Ambulatory Visit: Payer: Self-pay

## 2020-06-04 DIAGNOSIS — M25642 Stiffness of left hand, not elsewhere classified: Secondary | ICD-10-CM

## 2020-06-04 DIAGNOSIS — M79642 Pain in left hand: Secondary | ICD-10-CM

## 2020-06-04 DIAGNOSIS — R278 Other lack of coordination: Secondary | ICD-10-CM

## 2020-06-04 DIAGNOSIS — R29898 Other symptoms and signs involving the musculoskeletal system: Secondary | ICD-10-CM | POA: Diagnosis not present

## 2020-06-04 NOTE — Therapy (Signed)
[facility] [address] [place], [place], [address] Phone: [phone]   Fax:  [phone]  Occupational Therapy Treatment  Patient Details  Name: Melissa Meyers MRN: [id number] Date of Birth: 02-08-1966 Referring Provider (OT): Gertha Calkin, MD   Encounter Date: 06/04/2020   OT End of Session - 06/04/20 1729    Visit Number 5    Number of Visits 24    Date for OT Re-Evaluation 08/14/20    Authorization Type VA has authorized 15 visits at this time.    Authorization - Visit Number 5    Authorization - Number of Visits 15    OT Start Time 1647    OT Stop Time 1728    OT Time Calculation (min) 41 min    Activity Tolerance Patient tolerated treatment well    Behavior During Therapy WFL for tasks assessed/performed           Past Medical History:  Diagnosis Date   Acid reflux    Sinus drainage    Thyroid disease     Past Surgical History:  Procedure Laterality Date   TUBAL LIGATION      There were no vitals filed for this visit.   Subjective Assessment - 06/04/20 1646    Subjective  S: It's having tinges of pain.    Currently in Pain? Yes    Pain Score 3     Pain Location Wrist    Pain Orientation Left    Pain Descriptors / Indicators Sore    Pain Type Acute pain    Pain Radiating Towards N/A    Pain Onset More than a month ago    Pain Frequency Intermittent    Aggravating Factors  movement    Pain Relieving Factors pain medication, elevation    Effect of Pain on Daily Activities unable to use LUE for any tasks.    Multiple Pain Sites No              [facility] OT Assessment - 06/04/20 1645      Assessment   Medical Diagnosis left index finger flexor tendon repair      Precautions   Precautions Other (comment)    Precaution Comments follow protocol for flexor tendon repair via [place] hand protocol book. Week 4: able to complete A/ROM wrist flexion/extension, composite digit flexion/extension.    Required  Braces or Orthoses Other Brace/Splint    Other Brace/Splint dorsal blocking splint.                    OT Treatments/Exercises (OP) - 06/04/20 1703      Exercises   Exercises Hand;Wrist;Elbow      Additional Elbow Exercises   Sponges 7, 10, 9      Wrist Exercises   Other wrist exercises A/ROM wrist flexion/extension with 3-5 " hold at end of each movement. 10X      Hand Exercises   Other Hand Exercises Composite digit flexion and extension completed with 2-3 second hold at end of stretch-A/ROM; Pt with forearm supinated, OT performing passive flexion with extension of MPs and PIP/DIPs flexed, 10X P/ROM    Other Hand Exercises Thumb flexion/extension A/ROM 10X. Thumb A/ROM adduction/abduction      Manual Therapy   Manual Therapy Soft tissue mobilization;Myofascial release    Manual therapy comments Manual therapy completed prior to exercises.    Soft tissue mobilization Soft tissue mobilzation completed along surgical incision to decrease scar tissue and increase scar mobility.  Myofascial Release Myofascial release and manual gentle stretching completed to dorsal and volar aspect of left hand, and all digits to decrease fascial restrictions and increase joint mobility in a pain free zone.      Fine Motor Coordination (Hand/Wrist)   Fine Motor Coordination Tendon glides    Tendon Glides 10X A/ROM                    OT Short Term Goals - 05/24/20 1658      OT SHORT TERM GOAL #1   Title Patient will be educated and independent with HEP in order to safely mobilize her left hand in order to decrease risk of contracture and increase functional ROM.    Time 6    Period Months    Status On-going    Target Date 07/05/20      OT SHORT TERM GOAL #2   Title Patient will improve LUE forearm, wrist, and hand P/ROM to [facility] for improved functional use of LUE.    Time 6    Period Weeks    Status On-going      OT SHORT TERM GOAL #3   Title Patient will report a  decreased pain level of 3/10 when completing HEP and mobility exercises with left UE.    Time 6    Period Weeks    Status On-going      OT SHORT TERM GOAL #4   Title Pt will decrease left hand fascial restrictions and increase scar mobility in order to increase ability to form a full fist.    Time 6    Period Weeks    Status On-going             OT Long Term Goals - 05/24/20 1658      OT LONG TERM GOAL #1   Title Patient will return to prior level of function using her LUE as dominant with daily tasks.    Time 12    Period Weeks    Status On-going      OT LONG TERM GOAL #2   Title Patient will improve LUE A/ROM to WNL for improved ability to drive, and complete desired daily tasks at 75% or more of the time.    Time 12    Period Weeks    Status On-going      OT LONG TERM GOAL #3   Title Patient will improve LUE grip and pinch strength to [facility] in order to open up jars and containers.    Time 12    Period Weeks    Status On-going      OT LONG TERM GOAL #4   Title Patient will increase her fine motor coordination of her left hand while completing the nine hole peg test in 30" or less in the standardized fashion in order to return to utlizing her left hand for all desired coordination tasks.    Time 12    Period Weeks    Status On-going                 Plan - 06/04/20 1729    Clinical Impression Statement A: Pt presents with healed incision site, no open areas and skin much improved from last session. Continued with manual techniques along index finger and palm to decrease edema and improve fascial restrictions. Continued with A/ROM and added gentle tendon gliding, as well as adding passive stretching of MP joints per protocol. Also completed gentle in hand manipulation with soft sponges.  [place]al cuing for form and technique.    Body Structure / Function / Physical Skills ADL;Strength;Pain;GMC;Dexterity;Edema;UE functional use;ROM;IADL;Fascial restriction;Scar  mobility;Wound;Coordination;FMC;Skin integrity;Decreased knowledge of precautions;Flexibility    Plan P: Continue with week 5 protocol completing manual techniques and gentel A/ROM    OT Home Exercise Plan eval: continue completing HEP established by [facility]12/16: week 4 postop HEP provided.    Consulted and Agree with Plan of Care Patient           Patient will benefit from skilled therapeutic intervention in order to improve the following deficits and impairments:   Body Structure / Function / Physical Skills: ADL,Strength,Pain,GMC,Dexterity,Edema,UE functional use,ROM,IADL,Fascial restriction,Scar mobility,Wound,Coordination,FMC,Skin integrity,Decreased knowledge of precautions,Flexibility       Visit Diagnosis: Other lack of coordination  Stiffness of left hand, not elsewhere classified  Pain in left hand  Other symptoms and signs involving the musculoskeletal system    Problem List There are no problems to display for this patient.  Ezra Sites, OTR/L  [phone] 06/04/2020, 5:31 PM  [place] [facility] [address] [place], [place], [address] Phone: [phone]   Fax:  [phone]  Name: Melissa Meyers MRN: [id number] Date of Birth: [birth year]

## 2020-06-06 ENCOUNTER — Other Ambulatory Visit: Payer: Self-pay

## 2020-06-06 ENCOUNTER — Ambulatory Visit (HOSPITAL_COMMUNITY): Payer: No Typology Code available for payment source | Admitting: Specialist

## 2020-06-06 DIAGNOSIS — M25642 Stiffness of left hand, not elsewhere classified: Secondary | ICD-10-CM

## 2020-06-06 DIAGNOSIS — R278 Other lack of coordination: Secondary | ICD-10-CM

## 2020-06-06 DIAGNOSIS — R29898 Other symptoms and signs involving the musculoskeletal system: Secondary | ICD-10-CM

## 2020-06-06 DIAGNOSIS — M79642 Pain in left hand: Secondary | ICD-10-CM

## 2020-06-07 ENCOUNTER — Encounter (HOSPITAL_COMMUNITY): Payer: Self-pay | Admitting: Specialist

## 2020-06-07 NOTE — Therapy (Signed)
[facility] [address] [place], [place], [address] Phone: [phone]   Fax:  [phone]  Occupational Therapy Treatment  Patient Details  Name: Melissa Meyers MRN: [id number] Date of Birth: 1966-01-05 Referring Provider (OT): Gertha Calkin, MD   Encounter Date: 06/06/2020   OT End of Session - 06/07/20 1054    Visit Number 6    Number of Visits 24    Date for OT Re-Evaluation 08/14/20    Authorization Type VA has authorized 15 visits at this time.    Authorization - Visit Number 6    Authorization - Number of Visits 15    OT Start Time 1525    OT Stop Time 1605    OT Time Calculation (min) 40 min    Activity Tolerance Patient tolerated treatment well    Behavior During Therapy WFL for tasks assessed/performed           Past Medical History:  Diagnosis Date  . Acid reflux   . Sinus drainage   . Thyroid disease     Past Surgical History:  Procedure Laterality Date  . TUBAL LIGATION      There were no vitals filed for this visit.   Subjective Assessment - 06/07/20 1052    Subjective  S:  It feels a little swollen today.    Currently in Pain? Yes    Pain Score 3     Pain Location Hand    Pain Orientation Left    Pain Descriptors / Indicators Aching              [facility] OT Assessment - 06/07/20 0001      Assessment   Medical Diagnosis left index finger flexor tendon repair      Precautions   Precautions Other (comment)    Precaution Comments follow protocol for flexor tendon repair via [place] hand protocol book. Week 6: dc dorsal blocking splint, continue A/ROM exercises from week 4 and add passive exercises as outlined in the protocol.                    OT Treatments/Exercises (OP) - 06/07/20 0001      Exercises   Exercises Hand;Wrist;Elbow      Elbow Exercises   Other elbow exercises supination/pronation a/rom 10 X      Additional Elbow Exercises   Sponges 13, 16, 10      Wrist Exercises    Other wrist exercises A/ROM wrist flexion/extension with 3-5 " hold at end of each movement. 10X      Hand Exercises   Other Hand Exercises Composite digit flexion and extension completed with 2-3 second hold at end of stretch-A/ROM; Pt with forearm supinated, OT performing passive flexion with extension of MPs and PIP/DIPs flexed, 10X P/ROM    Other Hand Exercises passive PIP joint extension with MP joint flexed, blocking to the PIPJ with MPJ partially flexed, blocking to the DIPJ with the MP and PIPJ partially flexed all completed 10 times each      Manual Therapy   Manual Therapy Soft tissue mobilization;Myofascial release    Manual therapy comments Manual therapy completed prior to exercises.    Soft tissue mobilization Soft tissue mobilzation completed along surgical incision to decrease scar tissue and increase scar mobility.    Myofascial Release Myofascial release and manual gentle stretching completed to dorsal and volar aspect of left hand, and all digits to decrease fascial restrictions and increase joint mobility in a  pain free zone.      Fine Motor Coordination (Hand/Wrist)   Fine Motor Coordination Tendon glides    Tendon Glides 10X A/ROM                  OT Education - 06/07/20 1054    Education Details updated HEP to week 6 per protocol    Person(s) Educated Patient    Methods Explanation;Demonstration    Comprehension Verbalized understanding            OT Short Term Goals - 05/24/20 1658      OT SHORT TERM GOAL #1   Title Patient will be educated and independent with HEP in order to safely mobilize her left hand in order to decrease risk of contracture and increase functional ROM.    Time 6    Period Months    Status On-going    Target Date 07/05/20      OT SHORT TERM GOAL #2   Title Patient will improve LUE forearm, wrist, and hand P/ROM to [facility] for improved functional use of LUE.    Time 6    Period Weeks    Status On-going      OT SHORT TERM  GOAL #3   Title Patient will report a decreased pain level of 3/10 when completing HEP and mobility exercises with left UE.    Time 6    Period Weeks    Status On-going      OT SHORT TERM GOAL #4   Title Pt will decrease left hand fascial restrictions and increase scar mobility in order to increase ability to form a full fist.    Time 6    Period Weeks    Status On-going             OT Long Term Goals - 05/24/20 1658      OT LONG TERM GOAL #1   Title Patient will return to prior level of function using her LUE as dominant with daily tasks.    Time 12    Period Weeks    Status On-going      OT LONG TERM GOAL #2   Title Patient will improve LUE A/ROM to WNL for improved ability to drive, and complete desired daily tasks at 75% or more of the time.    Time 12    Period Weeks    Status On-going      OT LONG TERM GOAL #3   Title Patient will improve LUE grip and pinch strength to [facility] in order to open up jars and containers.    Time 12    Period Weeks    Status On-going      OT LONG TERM GOAL #4   Title Patient will increase her fine motor coordination of her left hand while completing the nine hole peg test in 30" or less in the standardized fashion in order to return to utlizing her left hand for all desired coordination tasks.    Time 12    Period Weeks    Status On-going                 Plan - 06/07/20 1055    Clinical Impression Statement A:  per protocol educated patient to dc dorsal blocking splint at home, may still want to wear in community.  added p/rom exercises for flexion and extension and additonal a/rom exercises, as well.  able to pick up 16 sponges this date, compared to 13 at last  session.    Body Structure / Function / Physical Skills ADL;Strength;Pain;GMC;Dexterity;Edema;UE functional use;ROM;IADL;Fascial restriction;Scar mobility;Wound;Coordination;FMC;Skin integrity;Decreased knowledge of precautions;Flexibility    Plan P:  continue week 6  protocol, determine if additonal splinting (extension orthosis) or buddy taping is needed.  gradually incrase composite passive extension at the MPJ, PIPJ, DIPJ.           Patient will benefit from skilled therapeutic intervention in order to improve the following deficits and impairments:   Body Structure / Function / Physical Skills: ADL,Strength,Pain,GMC,Dexterity,Edema,UE functional use,ROM,IADL,Fascial restriction,Scar mobility,Wound,Coordination,FMC,Skin integrity,Decreased knowledge of precautions,Flexibility       Visit Diagnosis: Other lack of coordination  Stiffness of left hand, not elsewhere classified  Pain in left hand  Other symptoms and signs involving the musculoskeletal system    Problem List There are no problems to display for this patient.   Shirlean Mylar, MHA, OTR/L [phone]  06/07/2020, 11:12 AM  [facility] [address] [place], [place], [address] Phone: [phone]   Fax:  [phone]  Name: HARLEE ECKROTH MRN: [id number] Date of Birth: 10-21-1965

## 2020-06-11 ENCOUNTER — Other Ambulatory Visit: Payer: Self-pay

## 2020-06-11 ENCOUNTER — Ambulatory Visit (HOSPITAL_COMMUNITY): Payer: No Typology Code available for payment source | Attending: Internal Medicine

## 2020-06-11 ENCOUNTER — Encounter (HOSPITAL_COMMUNITY): Payer: Self-pay

## 2020-06-11 DIAGNOSIS — M2560 Stiffness of unspecified joint, not elsewhere classified: Secondary | ICD-10-CM | POA: Insufficient documentation

## 2020-06-11 DIAGNOSIS — M79642 Pain in left hand: Secondary | ICD-10-CM | POA: Diagnosis present

## 2020-06-11 DIAGNOSIS — R278 Other lack of coordination: Secondary | ICD-10-CM | POA: Insufficient documentation

## 2020-06-11 DIAGNOSIS — M25642 Stiffness of left hand, not elsewhere classified: Secondary | ICD-10-CM | POA: Diagnosis present

## 2020-06-11 DIAGNOSIS — M25562 Pain in left knee: Secondary | ICD-10-CM | POA: Diagnosis present

## 2020-06-11 DIAGNOSIS — R29898 Other symptoms and signs involving the musculoskeletal system: Secondary | ICD-10-CM | POA: Insufficient documentation

## 2020-06-11 DIAGNOSIS — R262 Difficulty in walking, not elsewhere classified: Secondary | ICD-10-CM | POA: Diagnosis present

## 2020-06-11 DIAGNOSIS — G8929 Other chronic pain: Secondary | ICD-10-CM | POA: Insufficient documentation

## 2020-06-11 NOTE — Therapy (Signed)
[facility] [address] [place], [place], [address] Phone: [phone]   Fax:  [phone]  Occupational Therapy Treatment  Patient Details  Name: Melissa Meyers MRN: [id number] Date of Birth: [birth year] Referring Provider (OT): Gertha Calkin, MD   Encounter Date: 06/11/2020   OT End of Session - 06/11/20 1601    Visit Number 7    Number of Visits 24    Date for OT Re-Evaluation 08/14/20    Authorization Type VA has authorized 15 visits at this time.    Authorization - Visit Number 7    Authorization - Number of Visits 15    OT Start Time 1431    OT Stop Time 1513    OT Time Calculation (min) 42 min    Activity Tolerance Patient tolerated treatment well    Behavior During Therapy WFL for tasks assessed/performed           Past Medical History:  Diagnosis Date  . Acid reflux   . Sinus drainage   . Thyroid disease     Past Surgical History:  Procedure Laterality Date  . TUBAL LIGATION      There were no vitals filed for this visit.   Subjective Assessment - 06/11/20 1449    Subjective  S: I have been putting Vitamin E cream on my incision and I can notice a big improvement.    Currently in Pain? No/denies              [facility] OT Assessment - 06/11/20 1454      Assessment   Medical Diagnosis left index finger flexor tendon repair      Precautions   Precautions Other (comment)    Precaution Comments follow protocol for flexor tendon repair via [place] hand protocol book. Week 6: dc dorsal blocking splint, continue A/ROM exercises from week 4 and add passive exercises as outlined in the protocol.    Required Braces or Orthoses Other Brace/Splint    Other Brace/Splint dorsal blocking splint - in the community                    OT Treatments/Exercises (OP) - 06/11/20 1454      Exercises   Exercises Hand;Wrist;Elbow      Additional Elbow Exercises   Sponges 10, 17, 20      Hand Exercises   Thumb  Opposition Holding pipecleaner in right hand, pt used left hand to pick up small beads and string.    Other Hand Exercises Clothespin task (nonresistive). Pt placed 10 pins on the edge of container while focusing on wrist mobility (flexion, extension, and ulnar deviation) while placing and removing.    Other Hand Exercises using nonresistive clothespin, patient completed gentle pinch task while picking up a variety of sizes of pom poms from table top and placed them on container lid. Then used clothespin to pick up pom poms from container lid and place back on table.      Splinting   Splinting Pt provided with buddy tape velcro to provide support to 2nd and 3rd digit during gentle and light hand use.                  OT Education - 06/11/20 1604    Education Details Buddy tape/velcro use during gentle hand activities outside of wearing dorsal blocking splint. Pt able to donn/doff independently.    Person(s) Educated Patient    Methods Explanation;Demonstration    Comprehension Verbalized understanding;Returned  demonstration            OT Short Term Goals - 05/24/20 1658      OT SHORT TERM GOAL #1   Title Patient will be educated and independent with HEP in order to safely mobilize her left hand in order to decrease risk of contracture and increase functional ROM.    Time 6    Period Months    Status On-going    Target Date 07/05/20      OT SHORT TERM GOAL #2   Title Patient will improve LUE forearm, wrist, and hand P/ROM to [facility] for improved functional use of LUE.    Time 6    Period Weeks    Status On-going      OT SHORT TERM GOAL #3   Title Patient will report a decreased pain level of 3/10 when completing HEP and mobility exercises with left UE.    Time 6    Period Weeks    Status On-going      OT SHORT TERM GOAL #4   Title Pt will decrease left hand fascial restrictions and increase scar mobility in order to increase ability to form a full fist.    Time 6     Period Weeks    Status On-going             OT Long Term Goals - 05/24/20 1658      OT LONG TERM GOAL #1   Title Patient will return to prior level of function using her LUE as dominant with daily tasks.    Time 12    Period Weeks    Status On-going      OT LONG TERM GOAL #2   Title Patient will improve LUE A/ROM to WNL for improved ability to drive, and complete desired daily tasks at 75% or more of the time.    Time 12    Period Weeks    Status On-going      OT LONG TERM GOAL #3   Title Patient will improve LUE grip and pinch strength to [facility] in order to open up jars and containers.    Time 12    Period Weeks    Status On-going      OT LONG TERM GOAL #4   Title Patient will increase her fine motor coordination of her left hand while completing the nine hole peg test in 30" or less in the standardized fashion in order to return to utlizing her left hand for all desired coordination tasks.    Time 12    Period Weeks    Status On-going                 Plan - 06/11/20 1602    Clinical Impression Statement A: Education provided on use of buddy tape/velcro to use while complete gentle activities with left hand. Patient was able to complete light and gentle pinch activities using nonresistive clothespins. Coordination activity complete using small beads and pipecleaner with min difficulty. VC for form and technique were completed.    Body Structure / Function / Physical Skills ADL;Strength;Pain;GMC;Dexterity;Edema;UE functional use;ROM;IADL;Fascial restriction;Scar mobility;Wound;Coordination;FMC;Skin integrity;Decreased knowledge of precautions;Flexibility    Plan P: measure for MD appointment at the end of the week. Progress to week 6 of protocol.    Consulted and Agree with Plan of Care Patient           Patient will benefit from skilled therapeutic intervention in order to improve the following deficits and  impairments:   Body Structure / Function / Physical  Skills: ADL,Strength,Pain,GMC,Dexterity,Edema,UE functional use,ROM,IADL,Fascial restriction,Scar mobility,Wound,Coordination,FMC,Skin integrity,Decreased knowledge of precautions,Flexibility       Visit Diagnosis: Other symptoms and signs involving the musculoskeletal system  Pain in left hand  Stiffness of left hand, not elsewhere classified  Other lack of coordination    Problem List There are no problems to display for this patient.  Ailene Ravel, OTR/L,CBIS  [phone]  06/11/2020, 4:05 PM  [facility] [address] [place], [place], [address] Phone: [phone]   Fax:  [phone]  Name: Melissa Meyers MRN: [id number] Date of Birth: Feb 19, 1966

## 2020-06-13 ENCOUNTER — Ambulatory Visit (HOSPITAL_COMMUNITY): Payer: No Typology Code available for payment source

## 2020-06-13 ENCOUNTER — Other Ambulatory Visit: Payer: Self-pay

## 2020-06-13 ENCOUNTER — Encounter (HOSPITAL_COMMUNITY): Payer: Self-pay

## 2020-06-13 DIAGNOSIS — G8929 Other chronic pain: Secondary | ICD-10-CM

## 2020-06-13 DIAGNOSIS — M25642 Stiffness of left hand, not elsewhere classified: Secondary | ICD-10-CM

## 2020-06-13 DIAGNOSIS — M25562 Pain in left knee: Secondary | ICD-10-CM

## 2020-06-13 DIAGNOSIS — R278 Other lack of coordination: Secondary | ICD-10-CM

## 2020-06-13 DIAGNOSIS — R29898 Other symptoms and signs involving the musculoskeletal system: Secondary | ICD-10-CM | POA: Diagnosis not present

## 2020-06-13 DIAGNOSIS — M2560 Stiffness of unspecified joint, not elsewhere classified: Secondary | ICD-10-CM

## 2020-06-13 DIAGNOSIS — R262 Difficulty in walking, not elsewhere classified: Secondary | ICD-10-CM

## 2020-06-13 DIAGNOSIS — M79642 Pain in left hand: Secondary | ICD-10-CM

## 2020-06-13 NOTE — Patient Instructions (Signed)
Access Code: [email or web address] URL: [email or web address] Date: 06/13/2020 Prepared by: Shary Decamp  Exercises Supine Quad Set - 1 x daily - 7 x weekly - 3 sets - 10 reps - 2 sec hold Active Straight Leg Raise with Quad Set - 1 x daily - 7 x weekly - 3 sets - 10 reps - 2 sec hold Mini Squat - 1 x daily - 7 x weekly - 3 sets - 10 reps

## 2020-06-13 NOTE — Therapy (Addendum)
[facility] [address] [place], [place], [address] Phone: [phone]   Fax:  [phone]  Occupational Therapy Treatment  Patient Details  Name: Melissa Meyers MRN: [id number] Date of Birth: 1965/10/08 Referring Provider (OT): Gertha Calkin, MD (Hand Surgeon: Gerri Lins, MD (Route progress notes to him as well. ))   Encounter Date: 06/13/2020   OT End of Session - 06/13/20 1553    Visit Number 8    Number of Visits 24    Date for OT Re-Evaluation 08/14/20    Authorization Type VA has authorized 15 visits at this time.    Authorization - Visit Number 8    Authorization - Number of Visits 15    OT Start Time 1345    OT Stop Time 1430    OT Time Calculation (min) 45 min    Activity Tolerance Patient tolerated treatment well    Behavior During Therapy WFL for tasks assessed/performed           Past Medical History:  Diagnosis Date  . Acid reflux   . Sinus drainage   . Thyroid disease     Past Surgical History:  Procedure Laterality Date  . TUBAL LIGATION      There were no vitals filed for this visit.   Subjective Assessment - 06/13/20 1540    Subjective  S: If the swelling would go down I could bend this finger but it won't let it. I feel like it wants to.    Currently in Pain? No/denies              [facility] OT Assessment - 06/13/20 1350      Assessment   Medical Diagnosis left index finger flexor tendon repair    Referring Provider (OT) Gertha Calkin, MD   Hand Surgeon: Gerri Lins, MD (Route progress notes to him as well. )     Precautions   Precautions Other (comment)    Precaution Comments follow protocol for flexor tendon repair via [place] hand protocol book. Week 7: Gradually increase composite passive extension at the MCP, PIP, and DIP joints.  Week 6: dc dorsal blocking splint, continue A/ROM exercises from week 4 and add passive exercises as outlined in the protocol.    Required Braces or  Orthoses Other Brace/Splint    Other Brace/Splint dorsal blocking splint - in the community      Coordination   Left 9 Hole Peg Test 27.5"   previous: 41.6" with therapist providing peg set up     Edema   Edema left wrist: 17.0cm (right: 16.5 cm) left MCP joints: 20.0 cm (right 20.0 cm ) right index between MCP and PIP: 8.0 cm (right: 7.0 cm) right index between PIP and DIP: 6.0 cm (right: 6.0 cm) Left middle between MCP and PIP: 7.0 cm (right: 6.5 cm) left ring between MCP and PIP: 6.5 cm (right: 6.0 cm)      ROM / Strength   AROM / PROM / Strength AROM      AROM   Overall AROM Comments Ulnar and radial deviation measured for the first time this date.    AROM Assessment Site Wrist;Forearm;Finger;Thumb    Right/Left Forearm Left    Left Forearm Pronation 90 Degrees    Left Forearm Supination 90 Degrees    Right/Left Wrist Left    Left Wrist Extension 44 Degrees   previous; 40   Left Wrist Flexion 60 Degrees   previous: 42   Left  Wrist Radial Deviation 18 Degrees    Left Wrist Ulnar Deviation 30 Degrees    Right/Left Finger Left    Left Composite Finger Flexion 50%   2nd digit is 4 cm away from touching palm. 3-5th digits are 1 cm away from touching palm when forming a fist.                                                                  Left Hand AROM   L Thumb MCP 0-60 90 Degrees   previous: 50   L Thumb IP 0-80 62 Degrees   previous: 54   L Index  MCP 0-90 68 Degrees   previous: 64   L Index PIP 0-100 42 Degrees   previous: 46   L Index DIP 0-70 0 Degrees   previous: same   L Long  MCP 0-90 64 Degrees   previous: 52   L Long PIP 0-100 76 Degrees   previous: 68   L Long DIP 0-70 62 Degrees   previous: 22   L Ring  MCP 0-90 62 Degrees   previous: 46   L Ring PIP 0-100 90 Degrees   previous: 74   L Ring DIP 0-70 60 Degrees   previous: 28   L Little  MCP 0-90 66 Degrees   previous: 58   L Little PIP 0-100 80 Degrees   previous: 86   L Little DIP 0-70 70 Degrees    previous: 64                   OT Treatments/Exercises (OP) - 06/13/20 1548      Exercises   Exercises Hand      Manual Therapy   Manual Therapy Edema management    Edema Management Education and demonstration performed using 1 inch coban application to left index finger to decrease swelling. Attempted to fit edema glove over coban wrap to provide compression to base of 2nd digit. Did not fit over coban wrap.                  OT Education - 06/13/20 1552    Education Details Use of coban wrap to 2nd digit on left hand to decrease swelling and allow for greater ROM. Youtube video and handout use with demonstration.    Person(s) Educated Patient    Methods Explanation;Demonstration;Handout;Verbal cues    Comprehension Verbalized understanding;Returned demonstration            OT Short Term Goals - 05/24/20 1658      OT SHORT TERM GOAL #1   Title Patient will be educated and independent with HEP in order to safely mobilize her left hand in order to decrease risk of contracture and increase functional ROM.    Time 6    Period Months    Status On-going    Target Date 07/05/20      OT SHORT TERM GOAL #2   Title Patient will improve LUE forearm, wrist, and hand P/ROM to [facility] for improved functional use of LUE.    Time 6    Period Weeks    Status On-going      OT SHORT TERM GOAL #3   Title Patient will report a decreased pain level of 3/10  when completing HEP and mobility exercises with left UE.    Time 6    Period Weeks    Status On-going      OT SHORT TERM GOAL #4   Title Pt will decrease left hand fascial restrictions and increase scar mobility in order to increase ability to form a full fist.    Time 6    Period Weeks    Status On-going             OT Long Term Goals - 05/24/20 1658      OT LONG TERM GOAL #1   Title Patient will return to prior level of function using her LUE as dominant with daily tasks.    Time 12    Period Weeks     Status On-going      OT LONG TERM GOAL #2   Title Patient will improve LUE A/ROM to WNL for improved ability to drive, and complete desired daily tasks at 75% or more of the time.    Time 12    Period Weeks    Status On-going      OT LONG TERM GOAL #3   Title Patient will improve LUE grip and pinch strength to [facility] in order to open up jars and containers.    Time 12    Period Weeks    Status On-going      OT LONG TERM GOAL #4   Title Patient will increase her fine motor coordination of her left hand while completing the nine hole peg test in 30" or less in the standardized fashion in order to return to utlizing her left hand for all desired coordination tasks.    Time 12    Period Weeks    Status On-going                 Plan - 06/13/20 1556    Clinical Impression Statement A: Measurements taken during session for follow up appointment with hand surgeon on Friday (Dr. Gerri Lins). Patient is demonstrating increased in A/ROM for all digits except the PIP and DIP joints of the 2nd digit. Introduced use of coban wrap to help decrease edema in 2nd digit. Education and demonstration provided with VC for form and technique. Reviewed protocol for week 7 post op. Pt reports that is starting to use her left hand to stabilize and move button towards button hole, she is texting using both thumbs, she held dishes in her left hand while washing, and she helped her Mom fold blankets.    Body Structure / Function / Physical Skills ADL;Strength;Pain;GMC;Dexterity;Edema;UE functional use;ROM;IADL;Fascial restriction;Scar mobility;Wound;Coordination;FMC;Skin integrity;Decreased knowledge of precautions;Flexibility    Plan P: Follow up on MD appointment. Continue with week 7 post op protocol. Provide printout from [place] handbook for week 7 HEP progression.   Consulted and Agree with Plan of Care Patient           Patient will benefit from skilled therapeutic intervention in order to  improve the following deficits and impairments:   Body Structure / Function / Physical Skills: ADL,Strength,Pain,GMC,Dexterity,Edema,UE functional use,ROM,IADL,Fascial restriction,Scar mobility,Wound,Coordination,FMC,Skin integrity,Decreased knowledge of precautions,Flexibility       Visit Diagnosis: Pain in left hand  Stiffness of left hand, not elsewhere classified  Other lack of coordination  Other symptoms and signs involving the musculoskeletal system    Problem List There are no problems to display for this patient.  Limmie Patricia, OTR/L,CBIS  [phone]  06/13/2020, 4:08 PM  [facility]  [place], [place], [address] Phone: [phone]   Fax:  [phone]  Name: Melissa Meyers MRN: [id number] Date of Birth: June 27, 1965

## 2020-06-13 NOTE — Therapy (Addendum)
[place], [place], [address] Phone: [phone]   Fax:  [phone]  Physical Therapy Evaluation  Patient Details  Name: Melissa Meyers MRN: [id number] Date of Birth: 02-23-1966 Referring Provider (PT): Marsh Dolly   Encounter Date: 06/13/2020   PT End of Session - 06/13/20 1334    Visit Number 1    Number of Visits 15    Date for PT Re-Evaluation 08/01/20    Authorization Type [facility], 15 visits approved    Authorization - Visit Number 1    Authorization - Number of Visits 15    Progress Note Due on Visit 10    PT Start Time 1300    PT Stop Time 1345    PT Time Calculation (min) 45 min    Activity Tolerance Patient tolerated treatment well    Behavior During Therapy [facility] for tasks assessed/performed           Past Medical History:  Diagnosis Date  . Acid reflux   . Sinus drainage   . Thyroid disease     Past Surgical History:  Procedure Laterality Date  . TUBAL LIGATION      There were no vitals filed for this visit.    Subjective Assessment - 06/13/20 1304    Subjective Patient reports knee injury after 2013 when she was running to train for PT test and experienced a fall where she twisted her knee.  Reports she had an xray which reveals menisucs tear and patellar arthritis and reports knee pain/instability.  Patient reports recent fall where her knee buckled and struck her knee cap without any f/u since this latest injury. Patient had recent short stint with PT for her knee at the [place] but limited visits    How long can you stand comfortably? 15 min    How long can you walk comfortably? 15-20 min    Patient Stated Goals Be able to ride a bike again    Currently in Pain? Yes    Pain Score 5     Pain Location Knee    Pain Orientation Left    Pain Descriptors / Indicators Aching    Pain Type Chronic pain    Pain Onset More than a month ago    Pain Frequency Intermittent              [facility] PT  Assessment - 06/13/20 0001      Assessment   Medical Diagnosis Left knee pain    Referring Provider (PT) Marsh Dolly      Balance Screen   Has the patient fallen in the past 6 months Yes    How many times? 1    Has the patient had a decrease in activity level because of a fear of falling?  Yes    Is the patient reluctant to leave their home because of a fear of falling?  No      Prior Function   Level of Independence Independent    Leisure cycling, gym      Functional Tests   Functional tests Squat      Squat   Comments poor form symmetry and limited to 75 degrees of hip flexion due to left knee      ROM / Strength   AROM / PROM / Strength AROM;PROM;Strength      AROM   AROM Assessment Site Knee    Right/Left Wrist Left    Right/Left Knee Left  Left Knee Extension 0    Left Knee Flexion 100   firm end-feel     Strength   Strength Assessment Site Knee    Right/Left Knee Left    Left Knee Flexion 3+/5    Left Knee Extension 3/5   extensor lag with SLR     Ambulation/Gait   Ambulation/Gait Yes    Ambulation/Gait Assistance 7: Independent    Ambulation Distance (Feet) 260 Feet    Assistive device None    Gait Pattern Decreased stance time - left    Ambulation Surface Level    Gait velocity decreased    Gait Comments                      Objective measurements completed on examination: See above findings.               PT Education - 06/13/20 1332    Education Details pt education on exam findings and HEP activities and in anatomy of knee joint/meniscus as it pertains to body mechanics    Person(s) Educated Patient    Methods Explanation;Demonstration    Comprehension Verbalized understanding;Returned demonstration;Need further instruction            PT Short Term Goals - 06/13/20 1357      PT SHORT TERM GOAL #1   Title Patient will be independent and consistent with preliminary HEP for LE strength    Time 3    Period Weeks     Status New    Target Date 07/04/20      PT SHORT TERM GOAL #2   Title Patient will report at least 25% improvement in symptoms for improved quality of life.    Time 3    Period Weeks    Status New    Target Date 07/04/20      PT SHORT TERM GOAL #3   Title Patient will be able to ambulate at least * feet in in order to demonstrate improved gait speed for community ambulation.    Baseline 260 ft    Time 3    Period Weeks    Status New    Target Date 07/04/20      PT SHORT TERM GOAL #4   Title --    Baseline --    Time --    Period --    Status --    Target Date --             PT Long Term Goals - 06/13/20 1400      PT LONG TERM GOAL #1   Title Patient will demonstrate left knee flexion to 115 degrees to improve symmetry with sit-stand    Baseline 100 degrees left knee flexion    Time 7    Period Weeks    Status New    Target Date 08/01/20      PT LONG TERM GOAL #2   Title Patient will report left knee pain not exceeding 2/10 with functional squat    Baseline 5/10 to 75 degrees hip flexion    Time 7    Period Weeks    Status New    Target Date 08/01/20      PT LONG TERM GOAL #3   Title Patient will be able to bicycle x 15 minutes without pain to facilitate return to activity    Baseline unable    Time 7    Period Weeks    Status New  Target Date 08/01/20                  Plan - 06/13/20 1350    Clinical Impression Statement Patient is 55 yo female with hx of chronic left knee pain/instability which she attributes to an injury sustained while running in 2013.  Upon examination considerable atrophy of left VMO noted coupled with extensor lag when attempting straight leg raise.  Patient demonstrates gait dysfunction with decreased weight acceptance LLE and decrease in velocity appreciated per .  Patient exhibits difficulty with her body mechanics and functional squat movements with notable asymmetry using RLE to perform majority of  work/movement.  PT services indicated to improve LLE strength/ROM to facilitate functional mobility with decreased pain and to develop/instruct in comprehensive HEP to assist in return to PLOF and leisure activities    Personal Factors and Comorbidities Comorbidity 1;Time since onset of injury/illness/exacerbation    Comorbidities left hand injury    Examination-Activity Limitations Bend;Carry;Stairs;Squat;Locomotion Level;Stand    Examination-Participation Restrictions Community Activity;Other   exercise, bike riding   Stability/Clinical Decision Making Stable/Uncomplicated    Clinical Decision Making Low    Rehab Potential Good    PT Frequency 2x / week    PT Duration --   7 weeks   PT Treatment/Interventions ADLs/Self Care Home Management;Aquatic Therapy;Cryotherapy;Electrical Stimulation;Ultrasound;Traction;Moist Heat;DME Instruction;Gait training;Stair training;Functional mobility training;Therapeutic activities;Therapeutic exercise;Patient/family education;Neuromuscular re-education;Balance training;Manual techniques;Taping;Energy conservation;Dry needling;Passive range of motion;Spinal Manipulations;Joint Manipulations    PT Next Visit Plan Continue with strengthening with emphasis on left VMO, squat mechanics    PT Home Exercise Plan QS, SLR, mini-squats from elevated surface           Patient will benefit from skilled therapeutic intervention in order to improve the following deficits and impairments:  Abnormal gait,Decreased activity tolerance,Decreased endurance,Decreased range of motion,Difficulty walking,Decreased strength,Impaired flexibility,Pain  Visit Diagnosis: Chronic pain of left knee - Plan: PT plan of care cert/re-cert  Difficulty in walking, not elsewhere classified - Plan: PT plan of care cert/re-cert  Reduced active range of joint movement - Plan: PT plan of care cert/re-cert     Problem List There are no problems to display for this patient.  3:56 PM,  06/13/20 M. Shary Decamp, PT, DPT Physical Therapist- [place] Office Number: [phone]  [facility] [address] [place], [place], [address] Phone: [phone]   Fax:  [phone]  Name: Melissa Meyers MRN: [id number] Date of Birth: 1965/12/26

## 2020-06-18 ENCOUNTER — Other Ambulatory Visit: Payer: Self-pay

## 2020-06-18 ENCOUNTER — Encounter (HOSPITAL_COMMUNITY): Payer: Self-pay

## 2020-06-18 ENCOUNTER — Ambulatory Visit (HOSPITAL_COMMUNITY): Payer: No Typology Code available for payment source

## 2020-06-18 DIAGNOSIS — M25642 Stiffness of left hand, not elsewhere classified: Secondary | ICD-10-CM

## 2020-06-18 DIAGNOSIS — R262 Difficulty in walking, not elsewhere classified: Secondary | ICD-10-CM

## 2020-06-18 DIAGNOSIS — R29898 Other symptoms and signs involving the musculoskeletal system: Secondary | ICD-10-CM | POA: Diagnosis not present

## 2020-06-18 DIAGNOSIS — R278 Other lack of coordination: Secondary | ICD-10-CM

## 2020-06-18 DIAGNOSIS — G8929 Other chronic pain: Secondary | ICD-10-CM

## 2020-06-18 DIAGNOSIS — M25562 Pain in left knee: Secondary | ICD-10-CM

## 2020-06-18 DIAGNOSIS — M79642 Pain in left hand: Secondary | ICD-10-CM

## 2020-06-18 NOTE — Therapy (Signed)
[facility] [address] [place], [place], [address] Phone: [phone]   Fax:  [phone]  Physical Therapy Treatment  Patient Details  Name: KYLEIGH NANNINI MRN: [id number] Date of Birth: 05-21-1966 Referring Provider (PT): Gertha Calkin   Encounter Date: 06/18/2020   PT End of Session - 06/18/20 1517    Visit Number 2    Number of Visits 15    Date for PT Re-Evaluation 08/01/20    Authorization Type VA, 15 visits approved    Authorization - Visit Number 2    Authorization - Number of Visits 15    Progress Note Due on Visit 10    PT Start Time 1515    PT Stop Time 1600    PT Time Calculation (min) 45 min    Activity Tolerance Patient tolerated treatment well    Behavior During Therapy [facility] for tasks assessed/performed           Past Medical History:  Diagnosis Date  . Acid reflux   . Sinus drainage   . Thyroid disease     Past Surgical History:  Procedure Laterality Date  . TUBAL LIGATION      There were no vitals filed for this visit.   Subjective Assessment - 06/18/20 1515    Subjective Patient reports she was able to perform recumbent cycling x 10 minutes at the gym this morning and no adverse effects in left knee noted. Patient is motivated to progress her ROM and strength to return to gym-based strength routine.    How long can you stand comfortably? 15 min    How long can you walk comfortably? 15-20 min    Patient Stated Goals Be able to ride a bike again    Currently in Pain? No/denies    Pain Score 0-No pain    Pain Onset More than a month ago                             [facility] Adult PT Treatment/Exercise - 06/18/20 0001      Exercises   Exercises Knee/Hip      Knee/Hip Exercises: Standing   Knee Flexion Strengthening;Left;2 sets;10 reps   red t-loop   Hip Flexion Stengthening;Left;2 sets;10 reps   red t-loop   Other Standing Knee Exercises sidestepping with red t-loop 2x2 min       Knee/Hip Exercises: Seated   Long Arc Quad Strengthening;Left;2 sets;10 reps   red t-loop                 PT Education - 06/18/20 1601    Education Details pt education on activity modification for gym exercises and bicycle set-up for knee kinematics    Person(s) Educated Patient    Methods Explanation    Comprehension Verbalized understanding;Returned demonstration            PT Short Term Goals - 06/13/20 1357      PT SHORT TERM GOAL #1   Title Patient will be independent and consistent with preliminary HEP for LE strength    Time 3    Period Weeks    Status New    Target Date 07/04/20      PT SHORT TERM GOAL #2   Title Patient will report at least 25% improvement in symptoms for improved quality of life.    Time 3    Period Weeks    Status New    Target  Date 07/04/20      PT SHORT TERM GOAL #3   Title Patient will be able to ambulate at least * feet in in order to demonstrate improved gait speed for community ambulation.    Baseline 260 ft    Time 3    Period Weeks    Status New    Target Date 07/04/20      PT SHORT TERM GOAL #4   Title --    Baseline --    Time --    Period --    Status --    Target Date --             PT Long Term Goals - 06/13/20 1400      PT LONG TERM GOAL #1   Title Patient will demonstrate left knee flexion to 115 degrees to improve symmetry with sit-stand    Baseline 100 degrees left knee flexion    Time 7    Period Weeks    Status New    Target Date 08/01/20      PT LONG TERM GOAL #2   Title Patient will report left knee pain not exceeding 2/10 with functional squat    Baseline 5/10 to 75 degrees hip flexion    Time 7    Period Weeks    Status New    Target Date 08/01/20      PT LONG TERM GOAL #3   Title Patient will be able to bicycle x 15 minutes without pain to facilitate return to activity    Baseline unable    Time 7    Period Weeks    Status New    Target Date 08/01/20                  Plan - 06/18/20 1523    Clinical Impression Statement Patient returns to therapy and reports no adverse effects from recently implemented LLE strengthening program.  Pt tolerating increased open chain resisted exercises with HEP development.  Patient would benefit from continued services to improve strength and develop comprehensive gym program to progress self-management    Personal Factors and Comorbidities Comorbidity 1;Time since onset of injury/illness/exacerbation    Comorbidities left hand injury    Examination-Activity Limitations Bend;Carry;Stairs;Squat;Locomotion Level;Stand    Examination-Participation Restrictions Community Activity;Other   exercise, bike riding   Stability/Clinical Decision Making Stable/Uncomplicated    Rehab Potential Good    PT Frequency 2x / week    PT Duration --   7 weeks   PT Treatment/Interventions ADLs/Self Care Home Management;Aquatic Therapy;Cryotherapy;Electrical Stimulation;Ultrasound;Traction;Moist Heat;DME Instruction;Gait training;Stair training;Functional mobility training;Therapeutic activities;Therapeutic exercise;Patient/family education;Neuromuscular re-education;Balance training;Manual techniques;Taping;Energy conservation;Dry needling;Passive range of motion;Spinal Manipulations;Joint Manipulations    PT Next Visit Plan Continue with strengthening with emphasis on left VMO, squat mechanics    PT Home Exercise Plan QS, SLR, mini-squats from elevated surface. [place] PRE with red t-loop           Patient will benefit from skilled therapeutic intervention in order to improve the following deficits and impairments:  Abnormal gait,Decreased activity tolerance,Decreased endurance,Decreased range of motion,Difficulty walking,Decreased strength,Impaired flexibility,Pain  Visit Diagnosis: Chronic pain of left knee  Difficulty in walking, not elsewhere classified     Problem List There are no problems to display for this patient.  4:06 PM,  06/18/20 M. Shary Decamp, PT, DPT Physical Therapist- [place] Office Number: [phone]  [facility] [address] [place], [place], [address] Phone: [phone]   Fax:  [phone]  Name: ORLANDO THALMANN MRN: [id number] Date of Birth: 1966/04/26

## 2020-06-18 NOTE — Therapy (Signed)
[facility] [address] [place], [place], [address] Phone: [phone]   Fax:  [phone]  Occupational Therapy Treatment  Patient Details  Name: Melissa Meyers MRN: [id number] Date of Birth: [birth year] Referring Provider (OT): Gertha Calkin, MD (Hand Surgeon: Gerri Lins, MD (Route progress notes to him as well. ))   Encounter Date: 06/18/2020   OT End of Session - 06/18/20 1602    Visit Number 9    Number of Visits 24    Date for OT Re-Evaluation 08/14/20    Authorization Type VA has authorized 15 visits at this time.    Authorization - Visit Number 9    Authorization - Number of Visits 15    OT Start Time 1430    OT Stop Time 1513    OT Time Calculation (min) 43 min    Activity Tolerance Patient tolerated treatment well    Behavior During Therapy [facility] for tasks assessed/performed           Past Medical History:  Diagnosis Date  . Acid reflux   . Sinus drainage   . Thyroid disease     Past Surgical History:  Procedure Laterality Date  . TUBAL LIGATION      There were no vitals filed for this visit.   Subjective Assessment - 06/18/20 1547    Subjective  S: The doctor said if the swelling doesn't go down and we've done all we can in therapy, he may have to go in and release the scar tissue.    Currently in Pain? Yes    Pain Score 5     Pain Location Hand    Pain Orientation Left    Pain Descriptors / Indicators Aching    Pain Type Acute pain    Pain Onset More than a month ago    Pain Frequency Intermittent    Aggravating Factors  movement and use    Pain Relieving Factors pain medication, elevation    Effect of Pain on Daily Activities mod effect    Multiple Pain Sites No              [facility] OT Assessment - 06/18/20 1556      Assessment   Medical Diagnosis left index finger flexor tendon repair      Precautions   Precautions Other (comment)    Precaution Comments follow protocol for flexor tendon  repair via [place] hand protocol book. Week 7: Gradually increase composite passive extension at the MCP, PIP, and DIP joints.  Week 6: dc dorsal blocking splint, continue A/ROM exercises from week 4 and add passive exercises as outlined in the protocol.    Required Braces or Orthoses Other Brace/Splint    Other Brace/Splint dorsal blocking splint - in the community                    OT Treatments/Exercises (OP) - 06/18/20 1556      Exercises   Exercises Hand;Wrist      Wrist Exercises   Other wrist exercises P/ROM wrist flexion and extension completed 5X      Hand Exercises   MCPJ Flexion PROM;5 reps    MCPJ Extension PROM;5 reps    PIPJ Flexion PROM;5 reps    PIPJ Extension PROM;5 reps    DIPJ Flexion PROM;5 reps    DIPJ Extension PROM;5 reps    Joint Blocking Exercises Joint blocking of the MCP joint, DIP, and PIP joint of the index finger  completed by patient while following HEP printout. 10X      Manual Therapy   Manual Therapy Edema management;Taping;Soft tissue mobilization    Manual therapy comments Manual therapy completed prior to exercises.    Edema Management Kinsiotaping technique applied to left hand to aid with edema of the index finger primarily at the base proximal to MCP joint on volar aspect. Education provided on removal, wearing, and benefits.    Soft tissue mobilization Soft tissue mobilization complete to dorsal aspect of hand from index finger down to wrist to decrease fascial restrictions and scar tissue and allow for greater joint mobility.    Kinesiotex Edema                  OT Education - 06/18/20 1601    Education Details wrist extension stretch. kinsiotape for edema of left hand index finger    Person(s) Educated Patient    Methods Explanation    Comprehension Verbalized understanding            OT Short Term Goals - 05/24/20 1658      OT SHORT TERM GOAL #1   Title Patient will be educated and independent with HEP in order  to safely mobilize her left hand in order to decrease risk of contracture and increase functional ROM.    Time 6    Period Months    Status On-going    Target Date 07/05/20      OT SHORT TERM GOAL #2   Title Patient will improve LUE forearm, wrist, and hand P/ROM to [facility] for improved functional use of LUE.    Time 6    Period Weeks    Status On-going      OT SHORT TERM GOAL #3   Title Patient will report a decreased pain level of 3/10 when completing HEP and mobility exercises with left UE.    Time 6    Period Weeks    Status On-going      OT SHORT TERM GOAL #4   Title Pt will decrease left hand fascial restrictions and increase scar mobility in order to increase ability to form a full fist.    Time 6    Period Weeks    Status On-going             OT Long Term Goals - 05/24/20 1658      OT LONG TERM GOAL #1   Title Patient will return to prior level of function using her LUE as dominant with daily tasks.    Time 12    Period Weeks    Status On-going      OT LONG TERM GOAL #2   Title Patient will improve LUE A/ROM to WNL for improved ability to drive, and complete desired daily tasks at 75% or more of the time.    Time 12    Period Weeks    Status On-going      OT LONG TERM GOAL #3   Title Patient will improve LUE grip and pinch strength to [facility] in order to open up jars and containers.    Time 12    Period Weeks    Status On-going      OT LONG TERM GOAL #4   Title Patient will increase her fine motor coordination of her left hand while completing the nine hole peg test in 30" or less in the standardized fashion in order to return to utlizing her left hand for all desired coordination tasks.  Time 12    Period Weeks    Status On-going                 Plan - 06/18/20 1659    Clinical Impression Statement A: Patient reports some improvement with swelling of finger although no improvement noted with area just below MCP joint on the volar aspect of hand.  Kinsiotape applied to address edema with education provided on use and removal. Passively, patient is able to make a fist although loosely. Unable to achieve any active movement in the DIP joint of index finger. Manual techniques were completed to address scar tissue and fascial restrictions.    Body Structure / Function / Physical Skills ADL;Strength;Pain;GMC;Dexterity;Edema;UE functional use;ROM;IADL;Fascial restriction;Scar mobility;Wound;Coordination;FMC;Skin integrity;Decreased knowledge of precautions;Flexibility    Plan P: follow up on kinsiotape for edema. Progress to week 8 of protocol. Provide patient with print out of week 8 HEP. [place] to decrease scar tissue.    OT Home Exercise Plan eval: continue completing HEP established by [facility]12/16: week 4 postop HEP provided. 1/10: Week 7 HEP    Consulted and Agree with Plan of Care Patient           Patient will benefit from skilled therapeutic intervention in order to improve the following deficits and impairments:   Body Structure / Function / Physical Skills: ADL,Strength,Pain,GMC,Dexterity,Edema,UE functional use,ROM,IADL,Fascial restriction,Scar mobility,Wound,Coordination,FMC,Skin integrity,Decreased knowledge of precautions,Flexibility       Visit Diagnosis: Other symptoms and signs involving the musculoskeletal system  Other lack of coordination  Stiffness of left hand, not elsewhere classified  Pain in left hand    Problem List There are no problems to display for this patient.  Limmie Patricia, OTR/L,CBIS  [phone]  06/18/2020, 5:02 PM  [facility] [address] [place], [place], [address] Phone: [phone]   Fax:  [phone]  Name: Melissa Meyers MRN: [id number] Date of Birth: 07-28-1965

## 2020-06-18 NOTE — Patient Instructions (Signed)
WRIST EXTENSION STRETCH - TABLE  Place both hands on a table as shown and gently lean forward until a stretch is felt. Hold for 20-30 seconds. Complete 2-3 times.

## 2020-06-18 NOTE — Patient Instructions (Signed)
Access Code: [email or web address] URL: [email or web address] Date: 06/18/2020 Prepared by: Shary Decamp  Exercises Seated Knee Extension with Resistance - 1 x daily - 7 x weekly - 3 sets - 10 reps Standing Hamstring Curl with Resistance - 1 x daily - 7 x weekly - 3 sets - 10 reps - 2 hold Side Stepping with Resistance at Ankles - 1 x daily - 7 x weekly Supine Hip Flexion with Resistance Loop - 1 x daily - 7 x weekly - 3 sets - 10 reps

## 2020-06-20 ENCOUNTER — Encounter (HOSPITAL_COMMUNITY): Payer: Self-pay | Admitting: Physical Therapy

## 2020-06-20 ENCOUNTER — Ambulatory Visit (HOSPITAL_COMMUNITY): Payer: No Typology Code available for payment source

## 2020-06-20 ENCOUNTER — Encounter (HOSPITAL_COMMUNITY): Payer: Self-pay

## 2020-06-20 ENCOUNTER — Ambulatory Visit (HOSPITAL_COMMUNITY): Payer: No Typology Code available for payment source | Admitting: Physical Therapy

## 2020-06-20 ENCOUNTER — Other Ambulatory Visit: Payer: Self-pay

## 2020-06-20 DIAGNOSIS — R262 Difficulty in walking, not elsewhere classified: Secondary | ICD-10-CM

## 2020-06-20 DIAGNOSIS — R29898 Other symptoms and signs involving the musculoskeletal system: Secondary | ICD-10-CM

## 2020-06-20 DIAGNOSIS — G8929 Other chronic pain: Secondary | ICD-10-CM

## 2020-06-20 DIAGNOSIS — R278 Other lack of coordination: Secondary | ICD-10-CM

## 2020-06-20 DIAGNOSIS — M25642 Stiffness of left hand, not elsewhere classified: Secondary | ICD-10-CM

## 2020-06-20 DIAGNOSIS — M79642 Pain in left hand: Secondary | ICD-10-CM

## 2020-06-20 NOTE — Patient Instructions (Signed)
Access Code: [email or web address] URL: [email or web address] Date: 06/20/2020 Prepared by: Georges Lynch  Exercises Standing Heel Raise - 2-3 x daily - 7 x weekly - 2 sets - 10 reps Standing Hip Abduction - 2-3 x daily - 7 x weekly - 2 sets - 10 reps

## 2020-06-20 NOTE — Therapy (Signed)
[facility] [address] [place], [place], [address] Phone: [phone]   Fax:  [phone]  Physical Therapy Treatment  Patient Details  Name: Melissa Meyers MRN: [id number] Date of Birth: 05/31/1966 Referring Provider (PT): Gertha Calkin   Encounter Date: 06/20/2020   PT End of Session - 06/20/20 1436    Visit Number 3    Number of Visits 15    Date for PT Re-Evaluation 08/01/20    Authorization Type VA, 15 visits approved    Authorization - Visit Number 3    Authorization - Number of Visits 15    Progress Note Due on Visit 10    PT Start Time 1433    PT Stop Time 1515    PT Time Calculation (min) 42 min    Activity Tolerance Patient tolerated treatment well;Patient limited by pain    Behavior During Therapy [facility] for tasks assessed/performed           Past Medical History:  Diagnosis Date  . Acid reflux   . Sinus drainage   . Thyroid disease     Past Surgical History:  Procedure Laterality Date  . TUBAL LIGATION      There were no vitals filed for this visit.   Subjective Assessment - 06/20/20 1435    Subjective Patient says things are coming along slowly. Says squats are still hard to do.    How long can you stand comfortably? 15 min    How long can you walk comfortably? 15-20 min    Patient Stated Goals Be able to ride a bike again    Currently in Pain? Yes    Pain Score 5     Pain Location Knee    Pain Orientation Left;Anterior    Pain Descriptors / Indicators Aching    Pain Type Chronic pain    Pain Onset More than a month ago    Pain Frequency Constant                             [facility] Adult PT Treatment/Exercise - 06/20/20 0001      Knee/Hip Exercises: Stretches   Knee: Self-Stretch to increase Flexion Left;5 reps;10 seconds    Knee: Self-Stretch Limitations knee drive on 12 inch box    Gastroc Stretch Both;3 reps;30 seconds      Knee/Hip Exercises: Aerobic   Recumbent Bike 4 min EOS  for mobility      Knee/Hip Exercises: Standing   Heel Raises Both;2 sets;10 reps    Heel Raises Limitations from incline    Hip Abduction Both;10 reps;3 sets    Abduction Limitations RTB    Hip Extension Both;10 reps;3 sets    Extension Limitations RTB    Lateral Step Up Both;10 reps;Step Height: 4";2 sets;Hand Hold: 1    Forward Step Up Both;10 reps;Hand Hold: 2;Step Height: 4";2 sets    Functional Squat 2 sets;10 reps   red band at knees, to chiar with foam pad elevation for depth cue                   PT Short Term Goals - 06/13/20 1357      PT SHORT TERM GOAL #1   Title Patient will be independent and consistent with preliminary HEP for LE strength    Time 3    Period Weeks    Status New    Target Date 07/04/20  PT SHORT TERM GOAL #2   Title Patient will report at least 25% improvement in symptoms for improved quality of life.    Time 3    Period Weeks    Status New    Target Date 07/04/20      PT SHORT TERM GOAL #3   Title Patient will be able to ambulate at least * feet in in order to demonstrate improved gait speed for community ambulation.    Baseline 260 ft    Time 3    Period Weeks    Status New    Target Date 07/04/20      PT SHORT TERM GOAL #4   Title --    Baseline --    Time --    Period --    Status --    Target Date --             PT Long Term Goals - 06/13/20 1400      PT LONG TERM GOAL #1   Title Patient will demonstrate left knee flexion to 115 degrees to improve symmetry with sit-stand    Baseline 100 degrees left knee flexion    Time 7    Period Weeks    Status New    Target Date 08/01/20      PT LONG TERM GOAL #2   Title Patient will report left knee pain not exceeding 2/10 with functional squat    Baseline 5/10 to 75 degrees hip flexion    Time 7    Period Weeks    Status New    Target Date 08/01/20      PT LONG TERM GOAL #3   Title Patient will be able to bicycle x 15 minutes without pain to facilitate  return to activity    Baseline unable    Time 7    Period Weeks    Status New    Target Date 08/01/20                 Plan - 06/20/20 1513    Clinical Impression Statement Patient tolerated session well overall today. Patient continues to have some knee pain with squatting but activity graded to patient tolerance. Patient shows excess forward weight shifting and decreased knee excursion when squatting. Educated patient on proper form and mechanics and added red band around knees for tactile cues, as well as elevated seat to depth cues. Patient showed slight improvement in form but continues to compliant of same LT knee pain. Patient tolerated added forward and lateral step ups with no complaints. Educated patient on addition to HEP and issued handout. Patient will continue to benefit from skilled therapy services to progress knee strength and mobility to reduce pain and improve LOF with ADLs.    Personal Factors and Comorbidities Comorbidity 1;Time since onset of injury/illness/exacerbation    Comorbidities left hand injury    Examination-Activity Limitations Bend;Carry;Stairs;Squat;Locomotion Level;Stand    Examination-Participation Restrictions Community Activity;Other   exercise, bike riding   Stability/Clinical Decision Making Stable/Uncomplicated    Rehab Potential Good    PT Frequency 2x / week    PT Duration --   7 weeks   PT Treatment/Interventions ADLs/Self Care Home Management;Aquatic Therapy;Cryotherapy;Electrical Stimulation;Ultrasound;Traction;Moist Heat;DME Instruction;Gait training;Stair training;Functional mobility training;Therapeutic activities;Therapeutic exercise;Patient/family education;Neuromuscular re-education;Balance training;Manual techniques;Taping;Energy conservation;Dry needling;Passive range of motion;Spinal Manipulations;Joint Manipulations    PT Next Visit Plan Continue with strengthening with emphasis on left VMO, squat mechanics    PT Home Exercise Plan  QS, SLR, mini-squats  from elevated surface. OKC PRE with red t-loop, heel raise, standing hip abduction    Consulted and Agree with Plan of Care Patient           Patient will benefit from skilled therapeutic intervention in order to improve the following deficits and impairments:  Abnormal gait,Decreased activity tolerance,Decreased endurance,Decreased range of motion,Difficulty walking,Decreased strength,Impaired flexibility,Pain  Visit Diagnosis: Chronic pain of left knee  Difficulty in walking, not elsewhere classified     Problem List There are no problems to display for this patient.   3:16 PM, 06/20/20 Georges Lynch PT DPT  Physical Therapist with [facility]  [facility]  [phone]   [facility] [address] [place], [place], [address] Phone: [phone]   Fax:  [phone]  Name: DEZERAY PUCCIO MRN: [id number] Date of Birth: Jul 02, 1965

## 2020-06-20 NOTE — Therapy (Signed)
[facility] [address] [place], [place], [address] Phone: [phone]   Fax:  [phone]  Occupational Therapy Treatment  Patient Details  Name: NYIAH PIANKA MRN: [id number] Date of Birth: [birth year] Referring Provider (OT): Gertha Calkin, MD (Hand Surgeon: Gerri Lins, MD (Route progress notes to him as well. ))   Encounter Date: 06/20/2020   OT End of Session - 06/20/20 1558    Visit Number 10    Number of Visits 24    Date for OT Re-Evaluation 08/14/20    Authorization Type VA has authorized 15 visits at this time.    Authorization - Visit Number 10    Authorization - Number of Visits 15    OT Start Time 1348    OT Stop Time 1430    OT Time Calculation (min) 42 min    Activity Tolerance Patient tolerated treatment well    Behavior During Therapy WFL for tasks assessed/performed           Past Medical History:  Diagnosis Date  . Acid reflux   . Sinus drainage   . Thyroid disease     Past Surgical History:  Procedure Laterality Date  . TUBAL LIGATION      There were no vitals filed for this visit.   Subjective Assessment - 06/20/20 1428    Currently in Pain? Yes    Pain Score 5     Pain Location Hand    Pain Orientation Left    Pain Descriptors / Indicators Aching    Pain Type Acute pain    Pain Onset More than a month ago    Pain Frequency Intermittent    Aggravating Factors  movement and use    Pain Relieving Factors elevation    Effect of Pain on Daily Activities mod effect    Multiple Pain Sites No              [facility] OT Assessment - 06/20/20 1633      Assessment   Medical Diagnosis left index finger flexor tendon repair      Precautions   Precautions Other (comment)    Precaution Comments follow protocol for flexor tendon repair via [place] hand protocol book. Week 8 post op: May begin light resistance with putty. No heavy use of the hand. No sustained pinch until 3 months post op.                     OT Treatments/Exercises (OP) - 06/20/20 1554      Exercises   Exercises Hand      Hand Exercises   Other Hand Exercises A/ROM, P/ROM composite digit flexion/extension 10X. Completed 5X without assist from thumb then 5X with assist from thumb for 2nd digit flexion      Modalities   Modalities Electrical Stimulation;Ultrasound      Electrical Stimulation   Electrical Stimulation Location Volar medial aspect of left forearm    Electrical Stimulation Action [employer], [employer] Parameters 16.5 mA CC 8', Ramp: 5 seconds, 5/5    Electrical Stimulation Goals Strength;Neuromuscular facilitation      Ultrasound   Ultrasound Location Palm aspect of 2nd digit from PIP joint to below MCP joint    Ultrasound Parameters 1.5 W/cm2    Ultrasound Goals Edema      Manual Therapy   Manual Therapy Soft tissue mobilization    Manual therapy comments Manual therapy completed prior to exercises.  Soft tissue mobilization Soft tissue mobilization complete to dorsal aspect of hand index finger to decrease fascial restrictions and scar tissue and allow for greater joint mobility.                  OT Education - 06/20/20 1558    Education Details Provided week 8 post op HEP for progression. Yellow theraputty provided.    Person(s) Educated Patient    Methods Explanation;Handout    Comprehension Verbalized understanding            OT Short Term Goals - 05/24/20 1658      OT SHORT TERM GOAL #1   Title Patient will be educated and independent with HEP in order to safely mobilize her left hand in order to decrease risk of contracture and increase functional ROM.    Time 6    Period Months    Status On-going    Target Date 07/05/20      OT SHORT TERM GOAL #2   Title Patient will improve LUE forearm, wrist, and hand P/ROM to [facility] for improved functional use of LUE.    Time 6    Period Weeks    Status On-going      OT SHORT TERM GOAL #3   Title  Patient will report a decreased pain level of 3/10 when completing HEP and mobility exercises with left UE.    Time 6    Period Weeks    Status On-going      OT SHORT TERM GOAL #4   Title Pt will decrease left hand fascial restrictions and increase scar mobility in order to increase ability to form a full fist.    Time 6    Period Weeks    Status On-going             OT Long Term Goals - 05/24/20 1658      OT LONG TERM GOAL #1   Title Patient will return to prior level of function using her LUE as dominant with daily tasks.    Time 12    Period Weeks    Status On-going      OT LONG TERM GOAL #2   Title Patient will improve LUE A/ROM to WNL for improved ability to drive, and complete desired daily tasks at 75% or more of the time.    Time 12    Period Weeks    Status On-going      OT LONG TERM GOAL #3   Title Patient will improve LUE grip and pinch strength to [facility] in order to open up jars and containers.    Time 12    Period Weeks    Status On-going      OT LONG TERM GOAL #4   Title Patient will increase her fine motor coordination of her left hand while completing the nine hole peg test in 30" or less in the standardized fashion in order to return to utlizing her left hand for all desired coordination tasks.    Time 12    Period Weeks    Status On-going                 Plan - 06/20/20 1559    Clinical Impression Statement A: Patient reports kinsiotape remained in place until today. She did not notice any improvement with edema reduction. US performed to decrease scar tissue. NMES also performed to increase strength and provide neuromuscular stimulation in order to increase functional use of index finger.  Progressed HEP to week 8 with yellow theraputty provided. VC for form and technique were provided during session.    Body Structure / Function / Physical Skills ADL;Strength;Pain;GMC;Dexterity;Edema;UE functional use;ROM;IADL;Fascial restriction;Scar  mobility;Wound;Coordination;FMC;Skin integrity;Decreased knowledge of precautions;Flexibility    Plan P: Follow up on new HEP. Complete yellow theraputty per protocol for grip strength. Pinch strength without sustained pinch. Continue with NMES for strengthening and neuromuscular stimulation in the index finger with pads placed slightly more laterally.    OT Home Exercise Plan eval: continue completing HEP established by [facility]12/16: week 4 postop HEP provided. 1/10: Week 7 HEP 1/12: week 8 post op HEP; yellow theraputty    Consulted and Agree with Plan of Care Patient           Patient will benefit from skilled therapeutic intervention in order to improve the following deficits and impairments:   Body Structure / Function / Physical Skills: ADL,Strength,Pain,GMC,Dexterity,Edema,UE functional use,ROM,IADL,Fascial restriction,Scar mobility,Wound,Coordination,FMC,Skin integrity,Decreased knowledge of precautions,Flexibility       Visit Diagnosis: Other lack of coordination  Other symptoms and signs involving the musculoskeletal system  Stiffness of left hand, not elsewhere classified  Pain in left hand    Problem List There are no problems to display for this patient.  Limmie Patricia, OTR/L,CBIS  [phone]   06/20/2020, 4:48 PM  [facility] [address] [place], [place], [address] Phone: [phone]   Fax:  [phone]  Name: FLEUR AUDINO MRN: [id number] Date of Birth: June 24, 1965

## 2020-06-25 ENCOUNTER — Ambulatory Visit (HOSPITAL_COMMUNITY): Payer: No Typology Code available for payment source

## 2020-06-25 ENCOUNTER — Ambulatory Visit (HOSPITAL_COMMUNITY): Payer: No Typology Code available for payment source | Admitting: Physical Therapy

## 2020-06-27 ENCOUNTER — Ambulatory Visit (HOSPITAL_COMMUNITY): Payer: No Typology Code available for payment source | Admitting: Physical Therapy

## 2020-06-27 ENCOUNTER — Other Ambulatory Visit: Payer: Self-pay

## 2020-06-27 ENCOUNTER — Encounter (HOSPITAL_COMMUNITY): Payer: Self-pay | Admitting: Physical Therapy

## 2020-06-27 ENCOUNTER — Encounter (HOSPITAL_COMMUNITY): Payer: Self-pay

## 2020-06-27 ENCOUNTER — Ambulatory Visit (HOSPITAL_COMMUNITY): Payer: No Typology Code available for payment source

## 2020-06-27 DIAGNOSIS — R29898 Other symptoms and signs involving the musculoskeletal system: Secondary | ICD-10-CM | POA: Diagnosis not present

## 2020-06-27 DIAGNOSIS — M79642 Pain in left hand: Secondary | ICD-10-CM

## 2020-06-27 DIAGNOSIS — R278 Other lack of coordination: Secondary | ICD-10-CM

## 2020-06-27 DIAGNOSIS — M25642 Stiffness of left hand, not elsewhere classified: Secondary | ICD-10-CM

## 2020-06-27 DIAGNOSIS — R262 Difficulty in walking, not elsewhere classified: Secondary | ICD-10-CM

## 2020-06-27 DIAGNOSIS — G8929 Other chronic pain: Secondary | ICD-10-CM

## 2020-06-27 NOTE — Therapy (Signed)
[facility] [address] [place], [place], [address] Phone: [phone]   Fax:  [phone]  Physical Therapy Treatment  Patient Details  Name: Melissa Meyers MRN: [id number] Date of Birth: Mar 09, 1966 Referring Provider (PT): Gertha Calkin   Encounter Date: 06/27/2020   PT End of Session - 06/27/20 1435    Visit Number 4    Number of Visits 15    Date for PT Re-Evaluation 08/01/20    Authorization Type VA, 15 visits approved    Authorization - Visit Number 4    Authorization - Number of Visits 15    Progress Note Due on Visit 10    PT Start Time 1430    PT Stop Time 1513    PT Time Calculation (min) 43 min    Activity Tolerance Patient tolerated treatment well    Behavior During Therapy [facility] for tasks assessed/performed           Past Medical History:  Diagnosis Date  . Acid reflux   . Sinus drainage   . Thyroid disease     Past Surgical History:  Procedure Laterality Date  . TUBAL LIGATION      There were no vitals filed for this visit.   Subjective Assessment - 06/27/20 1434    Subjective Patient says she is doing ok. She says her knee was sore last week but she doesn't mind soreness. She says she feels like the exercises are helping strengthen the knee.    How long can you stand comfortably? 15 min    How long can you walk comfortably? 15-20 min    Patient Stated Goals Be able to ride a bike again    Currently in Pain? Yes    Pain Score 4     Pain Location Knee    Pain Orientation Left    Pain Descriptors / Indicators Aching    Pain Type Chronic pain    Pain Onset More than a month ago    Pain Frequency Constant                             [facility] Adult PT Treatment/Exercise - 06/27/20 0001      Knee/Hip Exercises: Stretches   Gastroc Stretch Both;3 reps;30 seconds      Knee/Hip Exercises: Machines for Strengthening   Total Gym Leg Press 2 x 10 40#    Other Machine machine walkout 40# x 10       Knee/Hip Exercises: Standing   Heel Raises Both;2 sets;10 reps    Lateral Step Up Both;10 reps;Step Height: 4";2 sets;Hand Hold: 1    Forward Step Up Both;10 reps;Hand Hold: 2;Step Height: 4";2 sets    Functional Squat 2 sets;10 reps   partial reps pain free range   SLS 2 x 15" solid floor, 2 x 15" foam    Other Standing Knee Exercises band sidestepping RTB 2 RT                    PT Short Term Goals - 06/13/20 1357      PT SHORT TERM GOAL #1   Title Patient will be independent and consistent with preliminary HEP for LE strength    Time 3    Period Weeks    Status New    Target Date 07/04/20      PT SHORT TERM GOAL #2   Title Patient will report at least 25%  improvement in symptoms for improved quality of life.    Time 3    Period Weeks    Status New    Target Date 07/04/20      PT SHORT TERM GOAL #3   Title Patient will be able to ambulate at least * feet in in order to demonstrate improved gait speed for community ambulation.    Baseline 260 ft    Time 3    Period Weeks    Status New    Target Date 07/04/20      PT SHORT TERM GOAL #4   Title --    Baseline --    Time --    Period --    Status --    Target Date --             PT Long Term Goals - 06/13/20 1400      PT LONG TERM GOAL #1   Title Patient will demonstrate left knee flexion to 115 degrees to improve symmetry with sit-stand    Baseline 100 degrees left knee flexion    Time 7    Period Weeks    Status New    Target Date 08/01/20      PT LONG TERM GOAL #2   Title Patient will report left knee pain not exceeding 2/10 with functional squat    Baseline 5/10 to 75 degrees hip flexion    Time 7    Period Weeks    Status New    Target Date 08/01/20      PT LONG TERM GOAL #3   Title Patient will be able to bicycle x 15 minutes without pain to facilitate return to activity    Baseline unable    Time 7    Period Weeks    Status New    Target Date 08/01/20                  Plan - 06/27/20 1632    Clinical Impression Statement Patient with improved activity tolerance today and very little complaint of pain with added exercise. Patient able to progress LE strengthening with addition of machine leg press and walk outs. Patient cued on proper form and educated on purpose of each. Added single leg stance for LE stabilization. Patient slightly less steady and requires more effort to perform in LLE stance. Added band hip abduction and extension as well as sidestepping to patient HEP. Patient will continue to benefit from skilled therapy services to reduce deficits for improving LOF with ADLs.    Personal Factors and Comorbidities Comorbidity 1;Time since onset of injury/illness/exacerbation    Comorbidities left hand injury    Examination-Activity Limitations Bend;Carry;Stairs;Squat;Locomotion Level;Stand    Examination-Participation Restrictions Community Activity;Other   exercise, bike riding   Stability/Clinical Decision Making Stable/Uncomplicated    Rehab Potential Good    PT Frequency 2x / week    PT Duration --   7 weeks   PT Treatment/Interventions ADLs/Self Care Home Management;Aquatic Therapy;Cryotherapy;Electrical Stimulation;Ultrasound;Traction;Moist Heat;DME Instruction;Gait training;Stair training;Functional mobility training;Therapeutic activities;Therapeutic exercise;Patient/family education;Neuromuscular re-education;Balance training;Manual techniques;Taping;Energy conservation;Dry needling;Passive range of motion;Spinal Manipulations;Joint Manipulations    PT Next Visit Plan Continue with strengthening with emphasis on left VMO, squat mechanics. Add HS curl machine, monster walks next visit    PT Home Exercise Plan QS, SLR, mini-squats from elevated surface. OKC PRE with red t-loop, heel raise, standing hip abduction 1/19 band hip abduction/ extension, band sidestepping    Consulted and Agree with Plan of Care Patient  Patient  will benefit from skilled therapeutic intervention in order to improve the following deficits and impairments:  Abnormal gait,Decreased activity tolerance,Decreased endurance,Decreased range of motion,Difficulty walking,Decreased strength,Impaired flexibility,Pain  Visit Diagnosis: Chronic pain of left knee  Difficulty in walking, not elsewhere classified     Problem List There are no problems to display for this patient.   4:38 PM, 06/27/20 Georges Lynch PT DPT  Physical Therapist with [facility]  [facility]  [phone]   [facility] [address] [place], [place], [address] Phone: [phone]   Fax:  [phone]  Name: Melissa Meyers MRN: [id number] Date of Birth: 1965-06-22

## 2020-06-27 NOTE — Therapy (Signed)
[facility] [address] [place], [place], [address] Phone: [phone]   Fax:  [phone]  Occupational Therapy Treatment  Patient Details  Name: Melissa Meyers MRN: [id number] Date of Birth: 09-22-1965 Referring Provider (OT): Gertha Calkin, MD (Hand Surgeon: Gerri Lins, MD (Route progress notes to him as well. ))   Encounter Date: 06/27/2020   OT End of Session - 06/27/20 1803    Visit Number 11    Number of Visits 24    Date for OT Re-Evaluation 08/14/20    Authorization Type VA has authorized 15 visits at this time.    Authorization - Visit Number 11    Authorization - Number of Visits 15    OT Start Time 1357   Pt arrived late   OT Stop Time 1429    OT Time Calculation (min) 32 min    Activity Tolerance Patient tolerated treatment well    Behavior During Therapy WFL for tasks assessed/performed           Past Medical History:  Diagnosis Date  . Acid reflux   . Sinus drainage   . Thyroid disease     Past Surgical History:  Procedure Laterality Date  . TUBAL LIGATION      There were no vitals filed for this visit.   Subjective Assessment - 06/27/20 1417    Subjective  S: I think the small finger on the last joint is just barely showing any movement.    Currently in Pain? Yes    Pain Score 5     Pain Location Hand   3rd-5th digit DIP joint, 2rd digit PIP joint   Pain Orientation Left    Pain Descriptors / Indicators Aching    Pain Type Acute pain    Pain Onset In the past 7 days    Pain Frequency Constant    Aggravating Factors  possibly use of NMES at last session    Pain Relieving Factors Nothing    Effect of Pain on Daily Activities mod effect    Multiple Pain Sites No              [facility] OT Assessment - 06/27/20 1757      Assessment   Medical Diagnosis left index finger flexor tendon repair      Precautions   Precautions Other (comment)    Precaution Comments follow protocol for flexor  tendon repair via [place] hand protocol book. Week 8 post op: May begin light resistance with putty. No heavy use of the hand. No sustained pinch until 3 months post op.                    OT Treatments/Exercises (OP) - 06/27/20 1757      Exercises   Exercises Hand;Theraputty      Hand Exercises   Other Hand Exercises Yellow putty - thumb opposition      Theraputty   Theraputty - Roll yellow    Theraputty - Grip yellow -pronated and supinated      Splinting   Splinting Small/Medium Left Flexion glove provided to increase flexion in all joints of 2nd digit. Rubberbands replaced with #16 (splint supplies).                  OT Education - 06/27/20 1802    Education Details education provided on flexion glove with handout. Recommended starting with 5-10 minutes of wear time 4-5 times a day. Increase up to 30  minutes.    Person(s) Educated Patient    Methods Explanation;Handout    Comprehension Verbalized understanding            OT Short Term Goals - 05/24/20 1658      OT SHORT TERM GOAL #1   Title Patient will be educated and independent with HEP in order to safely mobilize her left hand in order to decrease risk of contracture and increase functional ROM.    Time 6    Period Months    Status On-going    Target Date 07/05/20      OT SHORT TERM GOAL #2   Title Patient will improve LUE forearm, wrist, and hand P/ROM to [facility] for improved functional use of LUE.    Time 6    Period Weeks    Status On-going      OT SHORT TERM GOAL #3   Title Patient will report a decreased pain level of 3/10 when completing HEP and mobility exercises with left UE.    Time 6    Period Weeks    Status On-going      OT SHORT TERM GOAL #4   Title Pt will decrease left hand fascial restrictions and increase scar mobility in order to increase ability to form a full fist.    Time 6    Period Weeks    Status On-going             OT Long Term Goals - 05/24/20 1658       OT LONG TERM GOAL #1   Title Patient will return to prior level of function using her LUE as dominant with daily tasks.    Time 12    Period Weeks    Status On-going      OT LONG TERM GOAL #2   Title Patient will improve LUE A/ROM to WNL for improved ability to drive, and complete desired daily tasks at 75% or more of the time.    Time 12    Period Weeks    Status On-going      OT LONG TERM GOAL #3   Title Patient will improve LUE grip and pinch strength to [facility] in order to open up jars and containers.    Time 12    Period Weeks    Status On-going      OT LONG TERM GOAL #4   Title Patient will increase her fine motor coordination of her left hand while completing the nine hole peg test in 30" or less in the standardized fashion in order to return to utlizing her left hand for all desired coordination tasks.    Time 12    Period Weeks    Status On-going                 Plan - 06/27/20 1803    Clinical Impression Statement A: NMES was not completed this session as patient feels she may be sore due to use at previous session. Flexion glove provided to work on increasing flexion of 2nd digit. Patient is able to demonstrate activation flexion in 2nd digit DIP joint. DIP joint is functional and full range. MCP joint is able to achieve 75% of range. VC for form and technique were provided during session.    Body Structure / Function / Physical Skills ADL;Strength;Pain;GMC;Dexterity;Edema;UE functional use;ROM;IADL;Fascial restriction;Scar mobility;Wound;Coordination;FMC;Skin integrity;Decreased knowledge of precautions;Flexibility    Plan P: Follow up on use of flexion glove, in hand manipulation activity.    Consulted  and Agree with Plan of Care Patient           Patient will benefit from skilled therapeutic intervention in order to improve the following deficits and impairments:   Body Structure / Function / Physical Skills: ADL,Strength,Pain,GMC,Dexterity,Edema,UE functional  use,ROM,IADL,Fascial restriction,Scar mobility,Wound,Coordination,FMC,Skin integrity,Decreased knowledge of precautions,Flexibility       Visit Diagnosis: Other lack of coordination  Other symptoms and signs involving the musculoskeletal system  Stiffness of left hand, not elsewhere classified  Pain in left hand    Problem List There are no problems to display for this patient.  Limmie Patricia, OTR/L,CBIS  [phone]  06/27/2020, 6:10 PM  [facility] [address] [place], [place], [address] Phone: [phone]   Fax:  [phone]  Name: Melissa Meyers MRN: [id number] Date of Birth: 07-17-1965

## 2020-06-27 NOTE — Patient Instructions (Signed)
Access Code: [facility] URL: [email or web address] Date: 06/27/2020 Prepared by: Georges Lynch  Exercises Standing Terminal Knee Extension with Resistance - 1 x daily - 5 x weekly - 2 sets - 10 reps Hip Abduction with Resistance Loop - 1 x daily - 5 x weekly - 2 sets - 10 reps Hip Extension with Resistance Loop - 1 x daily - 5 x weekly - 2 sets - 10 reps Side Stepping with Resistance at Ankles - 1 x daily - 5 x weekly - 1 sets - 10 reps

## 2020-07-02 ENCOUNTER — Encounter (HOSPITAL_COMMUNITY): Payer: Self-pay | Admitting: Physical Therapy

## 2020-07-02 ENCOUNTER — Encounter (HOSPITAL_COMMUNITY): Payer: Self-pay

## 2020-07-02 ENCOUNTER — Ambulatory Visit (HOSPITAL_COMMUNITY): Payer: No Typology Code available for payment source

## 2020-07-02 ENCOUNTER — Other Ambulatory Visit: Payer: Self-pay

## 2020-07-02 ENCOUNTER — Ambulatory Visit (HOSPITAL_COMMUNITY): Payer: No Typology Code available for payment source | Admitting: Physical Therapy

## 2020-07-02 DIAGNOSIS — R29898 Other symptoms and signs involving the musculoskeletal system: Secondary | ICD-10-CM | POA: Diagnosis not present

## 2020-07-02 DIAGNOSIS — M25642 Stiffness of left hand, not elsewhere classified: Secondary | ICD-10-CM

## 2020-07-02 DIAGNOSIS — R262 Difficulty in walking, not elsewhere classified: Secondary | ICD-10-CM

## 2020-07-02 DIAGNOSIS — M25562 Pain in left knee: Secondary | ICD-10-CM

## 2020-07-02 DIAGNOSIS — G8929 Other chronic pain: Secondary | ICD-10-CM

## 2020-07-02 DIAGNOSIS — M79642 Pain in left hand: Secondary | ICD-10-CM

## 2020-07-02 DIAGNOSIS — R278 Other lack of coordination: Secondary | ICD-10-CM

## 2020-07-02 NOTE — Therapy (Signed)
[facility] [address] [place], [place], [address] Phone: [phone]   Fax:  [phone]  Occupational Therapy Treatment  Patient Details  Name: Melissa Meyers MRN: [id number] Date of Birth: 10-May-1966 Referring Provider (OT): Gertha Calkin, MD (Hand Surgeon: Gerri Lins, MD (Route progress notes to him as well. ))   Encounter Date: 07/02/2020   OT End of Session - 07/02/20 1513    Visit Number 12    Number of Visits 24    Date for OT Re-Evaluation 08/14/20    Authorization Type VA has authorized 15 visits at this time.    Authorization - Visit Number 12    Authorization - Number of Visits 15    OT Start Time 1348    OT Stop Time 1430    OT Time Calculation (min) 42 min    Activity Tolerance Patient tolerated treatment well    Behavior During Therapy WFL for tasks assessed/performed           Past Medical History:  Diagnosis Date  . Acid reflux   . Sinus drainage   . Thyroid disease     Past Surgical History:  Procedure Laterality Date  . TUBAL LIGATION      There were no vitals filed for this visit.   Subjective Assessment - 07/02/20 1508    Subjective  S: I'm trying to move this finger as much as I can (index).    Currently in Pain? Yes    Pain Score 4     Pain Location Hand    Pain Orientation Right    Pain Descriptors / Indicators Aching;Sore    Pain Type Acute pain    Pain Onset More than a month ago    Pain Frequency Constant    Aggravating Factors  increased use    Pain Relieving Factors nothing    Effect of Pain on Daily Activities mod effect    Multiple Pain Sites No              [facility] OT Assessment - 07/02/20 1509      Assessment   Medical Diagnosis left index finger tendon repair      Precautions   Precautions Other (comment)    Precaution Comments follow protocol for flexor tendon repair via [place] hand protocol book. Week 8 post op: May begin light resistance with putty. No heavy  use of the hand. No sustained pinch until 3 months post op.                    OT Treatments/Exercises (OP) - 07/02/20 1416      Exercises   Exercises Hand;Theraputty      Hand Exercises   Sponges 12,12      Modalities   Modalities Ultrasound      Ultrasound   Ultrasound Location Palm aspect of 2nd digit from PIP joint to MCP joint    Ultrasound Parameters 1.5 W/cm2    Ultrasound Goals Edema   decrease scar tissue     Splinting   Splinting Tightened rubberband on index finger of flexion glove.      Fine Motor Coordination (Hand/Wrist)   Fine Motor Coordination Flipping cards;In hand manipuation training;Picking up coins;Manipulating coins;Stacking coins    In Hand Manipulation Training Utilized [place] 4 and Sequence chips to complete in hand manipulation training while transferring from finger tip to palm and back to finger tip with 4 total held in hand. Able to stack into  tower. Management consultant used to focus on fine motor coordination and functiona use of index finger.    Flipping cards Patient held single card in left hand. Rotated card to the right and left while focusing on use of index finger as much as possible. Rotated card around index finger changing direction from left and right.    Picking up coins Pt picked up pennies one at a time while transferring from finger tip to palm working towards holding 5 total. Then transferred from palm to finger tip one at a time to stack in a tower. Picked up entire stack of coins to transfer into coin bank slot.                    OT Short Term Goals - 05/24/20 1658      OT SHORT TERM GOAL #1   Title Patient will be educated and independent with HEP in order to safely mobilize her left hand in order to decrease risk of contracture and increase functional ROM.    Time 6    Period Months    Status On-going    Target Date 07/05/20      OT SHORT TERM GOAL #2   Title Patient will improve LUE  forearm, wrist, and hand P/ROM to [facility] for improved functional use of LUE.    Time 6    Period Weeks    Status On-going      OT SHORT TERM GOAL #3   Title Patient will report a decreased pain level of 3/10 when completing HEP and mobility exercises with left UE.    Time 6    Period Weeks    Status On-going      OT SHORT TERM GOAL #4   Title Pt will decrease left hand fascial restrictions and increase scar mobility in order to increase ability to form a full fist.    Time 6    Period Weeks    Status On-going             OT Long Term Goals - 05/24/20 1658      OT LONG TERM GOAL #1   Title Patient will return to prior level of function using her LUE as dominant with daily tasks.    Time 12    Period Weeks    Status On-going      OT LONG TERM GOAL #2   Title Patient will improve LUE A/ROM to WNL for improved ability to drive, and complete desired daily tasks at 75% or more of the time.    Time 12    Period Weeks    Status On-going      OT LONG TERM GOAL #3   Title Patient will improve LUE grip and pinch strength to [facility] in order to open up jars and containers.    Time 12    Period Weeks    Status On-going      OT LONG TERM GOAL #4   Title Patient will increase her fine motor coordination of her left hand while completing the nine hole peg test in 30" or less in the standardized fashion in order to return to utlizing her left hand for all desired coordination tasks.    Time 12    Period Weeks    Status On-going                 Plan - 07/02/20 1515    Clinical Impression Statement A: [place] utilized  this session to decrease edema and scar tissue. Adjustments made to flexion glove to increase amount of 2nd digit flexion passively. Completed in hand manipulation task while focusing on flexion and extension of DIP joint in left hand 2nd digit. Max difficult with flexion during all activities with minimal movement noted. Improvement noted with MCP and PIP joint flexion. VC  for form and technique were provided.    Body Structure / Function / Physical Skills ADL;Strength;Pain;GMC;Dexterity;Edema;UE functional use;ROM;IADL;Fascial restriction;Scar mobility;Wound;Coordination;FMC;Skin integrity;Decreased knowledge of precautions;Flexibility    Plan P: Progress to week 10-12 post op of protocol.    Consulted and Agree with Plan of Care Patient           Patient will benefit from skilled therapeutic intervention in order to improve the following deficits and impairments:   Body Structure / Function / Physical Skills: ADL,Strength,Pain,GMC,Dexterity,Edema,UE functional use,ROM,IADL,Fascial restriction,Scar mobility,Wound,Coordination,FMC,Skin integrity,Decreased knowledge of precautions,Flexibility       Visit Diagnosis: Pain in left hand  Stiffness of left hand, not elsewhere classified  Other symptoms and signs involving the musculoskeletal system  Other lack of coordination    Problem List There are no problems to display for this patient.  Limmie Patricia, OTR/L,CBIS  [phone]  07/02/2020, 4:00 PM  [facility] [address] [place], [place], [address] Phone: [phone]   Fax:  [phone]  Name: Melissa Meyers MRN: [id number] Date of Birth: 11/05/1965

## 2020-07-02 NOTE — Therapy (Signed)
[facility] [address] [place], [place], [address] Phone: [phone]   Fax:  [phone]  Physical Therapy Treatment  Patient Details  Name: Melissa Meyers MRN: [id number] Date of Birth: 05-22-1966 Referring Provider (PT): Gertha Calkin   Encounter Date: 07/02/2020   PT End of Session - 07/02/20 1413    Visit Number 5    Number of Visits 15    Date for PT Re-Evaluation 08/01/20    Authorization Type VA, 15 visits approved    Authorization - Visit Number 5    Authorization - Number of Visits 15    Progress Note Due on Visit 10    PT Start Time 1433    PT Stop Time 1513    PT Time Calculation (min) 40 min    Activity Tolerance Patient tolerated treatment well    Behavior During Therapy [facility] for tasks assessed/performed           Past Medical History:  Diagnosis Date  . Acid reflux   . Sinus drainage   . Thyroid disease     Past Surgical History:  Procedure Laterality Date  . TUBAL LIGATION      There were no vitals filed for this visit.   Subjective Assessment - 07/02/20 1412    Subjective States that most of her exercises are going well but squats are challenging and painful. States that her current pain is 5/10 described a little bit of everything at times.    How long can you stand comfortably? 15 min    How long can you walk comfortably? 15-20 min    Patient Stated Goals Be able to ride a bike again    Currently in Pain? Yes    Pain Score 5     Pain Location Knee    Pain Orientation Left    Pain Descriptors / Indicators Aching;Stabbing    Pain Onset More than a month ago              [facility] PT Assessment - 07/02/20 0001      Assessment   Medical Diagnosis Left knee pain    Referring Provider (PT) Gertha Calkin                         [facility] Adult PT Treatment/Exercise - 07/02/20 1440      Knee/Hip Exercises: Seated   Long Arc Quad AROM;Left;3 sets;10 reps   2#   Other Seated Knee/Hip  Exercises contract/relax - 6 minutes total      Knee/Hip Exercises: Supine   Heel Slides Left;20 reps   2 minutes total with holds at end range   Knee Flexion AROM;Left    Knee Flexion Limitations 110      Manual Therapy   Manual Therapy Joint mobilization;Soft tissue mobilization    Manual therapy comments all manual interventions performed independently of other interventions    Joint Mobilization to left knee- traction - at edge of bed - felt good- performed wiht and without knee ROM, tib/fib grade II mobs PA and AP    Soft tissue mobilization to left adductors, pes anserinus and soleus - referred pain to medial knee                  PT Education - 07/02/20 1505    Education Details on knee anatomy, on exercises and rationale. on relaxing muscles at rest    Person(s) Educated Patient    Methods Explanation  Comprehension Verbalized understanding            PT Short Term Goals - 06/13/20 1357      PT SHORT TERM GOAL #1   Title Patient will be independent and consistent with preliminary HEP for LE strength    Time 3    Period Weeks    Status New    Target Date 07/04/20      PT SHORT TERM GOAL #2   Title Patient will report at least 25% improvement in symptoms for improved quality of life.    Time 3    Period Weeks    Status New    Target Date 07/04/20      PT SHORT TERM GOAL #3   Title Patient will be able to ambulate at least * feet in in order to demonstrate improved gait speed for community ambulation.    Baseline 260 ft    Time 3    Period Weeks    Status New    Target Date 07/04/20      PT SHORT TERM GOAL #4   Title --    Baseline --    Time --    Period --    Status --    Target Date --             PT Long Term Goals - 06/13/20 1400      PT LONG TERM GOAL #1   Title Patient will demonstrate left knee flexion to 115 degrees to improve symmetry with sit-stand    Baseline 100 degrees left knee flexion    Time 7    Period Weeks     Status New    Target Date 08/01/20      PT LONG TERM GOAL #2   Title Patient will report left knee pain not exceeding 2/10 with functional squat    Baseline 5/10 to 75 degrees hip flexion    Time 7    Period Weeks    Status New    Target Date 08/01/20      PT LONG TERM GOAL #3   Title Patient will be able to bicycle x 15 minutes without pain to facilitate return to activity    Baseline unable    Time 7    Period Weeks    Status New    Target Date 08/01/20                 Plan - 07/02/20 1414    Clinical Impression Statement Patient with improved knee ROM during session and reduced pain after exercises. Traction tolerated well and palpation to medial leg muscles with referred pain to left knee. Educated patient in knee ROM exercises and about holding knee in one position (tenses up and holds knee in about 30 degrees of knee flexion even at rest and against gravity. Will continue with current POC as tolerated by patient.    Personal Factors and Comorbidities Comorbidity 1;Time since onset of injury/illness/exacerbation    Comorbidities left hand injury    Examination-Activity Limitations Bend;Carry;Stairs;Squat;Locomotion Level;Stand    Examination-Participation Restrictions Community Activity;Other   exercise, bike riding   Stability/Clinical Decision Making Stable/Uncomplicated    Rehab Potential Good    PT Frequency 2x / week    PT Duration --   7 weeks   PT Treatment/Interventions ADLs/Self Care Home Management;Aquatic Therapy;Cryotherapy;Electrical Stimulation;Ultrasound;Traction;Moist Heat;DME Instruction;Gait training;Stair training;Functional mobility training;Therapeutic activities;Therapeutic exercise;Patient/family education;Neuromuscular re-education;Balance training;Manual techniques;Taping;Energy conservation;Dry needling;Passive range of motion;Spinal Manipulations;Joint Manipulations    PT  Next Visit Plan Continue with strengthening with emphasis on left VMO,  squat mechanics. Add HS curl machine, monster walks next visit    PT Home Exercise Plan QS, SLR, mini-squats from elevated surface. OKC PRE with red t-loop, heel raise, standing hip abduction 1/19 band hip abduction/ extension, band sidestepping; 1/24 heel slides, LAQs    Consulted and Agree with Plan of Care Patient           Patient will benefit from skilled therapeutic intervention in order to improve the following deficits and impairments:  Abnormal gait,Decreased activity tolerance,Decreased endurance,Decreased range of motion,Difficulty walking,Decreased strength,Impaired flexibility,Pain  Visit Diagnosis: Chronic pain of left knee  Difficulty in walking, not elsewhere classified     Problem List There are no problems to display for this patient.  3:21 PM, 07/02/20 Tereasa Coop, DPT Physical Therapy with [facility]  [phone] office  [facility] [address] [place], [place], [address] Phone: [phone]   Fax:  [phone]  Name: CANDACE BEGUE MRN: [id number] Date of Birth: Oct 12, 1965

## 2020-07-02 NOTE — Patient Instructions (Signed)
Access Code: [email or web address] URL: [email or web address] Date: 07/02/2020 Prepared by: Revonda Humphrey  Exercises Supine Heel Slide - 1 x daily - 7 x weekly - 2 sets - 15 reps - 10 hold Seated Long Arc Quad - 1 x daily - 7 x weekly - 3 sets - 10 reps - 5 hold

## 2020-07-04 ENCOUNTER — Ambulatory Visit (HOSPITAL_COMMUNITY): Payer: No Typology Code available for payment source

## 2020-07-04 ENCOUNTER — Encounter (HOSPITAL_COMMUNITY): Payer: Self-pay | Admitting: Physical Therapy

## 2020-07-04 ENCOUNTER — Other Ambulatory Visit: Payer: Self-pay

## 2020-07-04 ENCOUNTER — Ambulatory Visit (HOSPITAL_COMMUNITY): Payer: No Typology Code available for payment source | Admitting: Physical Therapy

## 2020-07-04 ENCOUNTER — Encounter (HOSPITAL_COMMUNITY): Payer: No Typology Code available for payment source | Admitting: Occupational Therapy

## 2020-07-04 DIAGNOSIS — R278 Other lack of coordination: Secondary | ICD-10-CM

## 2020-07-04 DIAGNOSIS — M79642 Pain in left hand: Secondary | ICD-10-CM

## 2020-07-04 DIAGNOSIS — G8929 Other chronic pain: Secondary | ICD-10-CM

## 2020-07-04 DIAGNOSIS — R29898 Other symptoms and signs involving the musculoskeletal system: Secondary | ICD-10-CM

## 2020-07-04 DIAGNOSIS — R262 Difficulty in walking, not elsewhere classified: Secondary | ICD-10-CM

## 2020-07-04 DIAGNOSIS — M25562 Pain in left knee: Secondary | ICD-10-CM

## 2020-07-04 DIAGNOSIS — M25642 Stiffness of left hand, not elsewhere classified: Secondary | ICD-10-CM

## 2020-07-04 NOTE — Therapy (Addendum)
[facility] [address] [place], [place], [address] Phone: [phone]   Fax:  [phone]  Occupational Therapy Treatment  Patient Details  Name: Melissa Meyers MRN: [id number] Date of Birth: 1966-02-22 Referring Provider (OT): Gertha Calkin, MD (Hand Surgeon: Gerri Lins, MD (Route progress notes to him as well. ))   Encounter Date: 07/04/2020   OT End of Session - 07/04/20 1511    Visit Number 13    Number of Visits 24    Date for OT Re-Evaluation 08/14/20    Authorization Type VA has authorized 15 visits at this time.    Authorization - Visit Number 13    Authorization - Number of Visits 15    OT Start Time 1345    OT Stop Time 1426    OT Time Calculation (min) 41 min    Activity Tolerance Patient tolerated treatment well    Behavior During Therapy WFL for tasks assessed/performed           Past Medical History:  Diagnosis Date  . Acid reflux   . Sinus drainage   . Thyroid disease     Past Surgical History:  Procedure Laterality Date  . TUBAL LIGATION      There were no vitals filed for this visit.   Subjective Assessment - 07/04/20 1401    Subjective  S: I'm still trying to get this finger to move. I'm able to reach my palm with the rest of my fingers.    Currently in Pain? Yes    Pain Score 4     Pain Location Hand    Pain Descriptors / Indicators Aching;Sore    Pain Type Acute pain    Pain Onset More than a month ago    Pain Frequency Constant    Aggravating Factors  increased use    Pain Relieving Factors nothing    Effect of Pain on Daily Activities mod effect    Multiple Pain Sites No              [facility] OT Assessment - 07/04/20 1406      Assessment   Medical Diagnosis left index finger tendon repair      Precautions   Precautions Other (comment)    Precaution Comments follow protocol for flexor tendon repair via [place] hand protocol book. Week 10-12 postop: The hand may be used for all  ADL tasks. No lifting greater than 5lbs. Continue to refrain from attempting a tight sustained grip against resistance      ROM / Strength   AROM / PROM / Strength Strength      Strength   Overall Strength Comments hand strength assessed for the first time this date.    Strength Assessment Site Hand    Right/Left hand Left;Right    Right Hand Grip (lbs) 70    Right Hand Lateral Pinch 16 lbs    Right Hand 3 Point Pinch 12 lbs    Left Hand Grip (lbs) 10    Left Hand Lateral Pinch 11 lbs    Left Hand 3 Point Pinch 5 lbs                    OT Treatments/Exercises (OP) - 07/04/20 1403      Exercises   Exercises Hand;Theraputty      Hand Exercises   Hand Gripper with Large Beads all beads with gripper set at 7#   horizontal   Hand Gripper with Medium  Beads all beads with gripper set at 7#   horizontal   Hand Gripper with Small Beads all beads with gripper set 7#   horizontal   Sponges Using soft resistance sponges and yellow resistive clothespin, patient used a 3 point pinch to pick up sponges and place in container.    Other Hand Exercises yellow putty finger tip push and drag focusing on gentle DIP flexion.      Theraputty   Theraputty - Flatten With yellow putty flat, patient placed extended 2nd digit on putty and gently pushes down while focusing on activation of DIP joint of 2nd digit.      Modalities   Modalities Ultrasound      Ultrasound   Ultrasound Location Palm aspect of 2nd digit from PIP to MCP joint    Ultrasound Parameters 1.5 W/cm2    Ultrasound Goals Edema   decrease scar tissue                 OT Education - 07/04/20 1511    Education Details Reviewed protocol for week 10-12. No change in HEP.    Person(s) Educated Patient    Methods Explanation    Comprehension Verbalized understanding            OT Short Term Goals - 05/24/20 1658      OT SHORT TERM GOAL #1   Title Patient will be educated and independent with HEP in order to  safely mobilize her left hand in order to decrease risk of contracture and increase functional ROM.    Time 6    Period Months    Status On-going    Target Date 07/05/20      OT SHORT TERM GOAL #2   Title Patient will improve LUE forearm, wrist, and hand P/ROM to [facility] for improved functional use of LUE.    Time 6    Period Weeks    Status On-going      OT SHORT TERM GOAL #3   Title Patient will report a decreased pain level of 3/10 when completing HEP and mobility exercises with left UE.    Time 6    Period Weeks    Status On-going      OT SHORT TERM GOAL #4   Title Pt will decrease left hand fascial restrictions and increase scar mobility in order to increase ability to form a full fist.    Time 6    Period Weeks    Status On-going             OT Long Term Goals - 05/24/20 1658      OT LONG TERM GOAL #1   Title Patient will return to prior level of function using her LUE as dominant with daily tasks.    Time 12    Period Weeks    Status On-going      OT LONG TERM GOAL #2   Title Patient will improve LUE A/ROM to WNL for improved ability to drive, and complete desired daily tasks at 75% or more of the time.    Time 12    Period Weeks    Status On-going      OT LONG TERM GOAL #3   Title Patient will improve LUE grip and pinch strength to [facility] in order to open up jars and containers.    Time 12    Period Weeks    Status On-going      OT LONG TERM GOAL #4   Title  Patient will increase her fine motor coordination of her left hand while completing the nine hole peg test in 30" or less in the standardized fashion in order to return to utlizing her left hand for all desired coordination tasks.    Time 12    Period Weeks    Status On-going                 Plan - 07/04/20 1512    Clinical Impression Statement A: Started session with [place] to help decrease edema and scar tissue. Started very light grip and pinch activities this session while focusing on activation of  DIP flexion of 2nd digit. Without resistance no active DIP movement noted. When pinching yellow resistive clothespin, patient was able to demonstrate very brief muscle activation in the DIP joint with some flexion noted. Unable to sustain DIP flexion when achieved although able to demonstrate it at several different times.    Body Structure / Function / Physical Skills ADL;Strength;Pain;GMC;Dexterity;Edema;UE functional use;ROM;IADL;Fascial restriction;Scar mobility;Wound;Coordination;FMC;Skin integrity;Decreased knowledge of precautions;Flexibility         Plan: Continue to work on increase ROM of left hand while remaining within protocol.   Patient will benefit from skilled therapeutic intervention in order to improve the following deficits and impairments:   Body Structure / Function / Physical Skills: ADL,Strength,Pain,GMC,Dexterity,Edema,UE functional use,ROM,IADL,Fascial restriction,Scar mobility,Wound,Coordination,FMC,Skin integrity,Decreased knowledge of precautions,Flexibility       Visit Diagnosis: Pain in left hand  Stiffness of left hand, not elsewhere classified  Other symptoms and signs involving the musculoskeletal system  Other lack of coordination    Problem List There are no problems to display for this patient.   Limmie Patricia, OTR/L,CBIS  [phone]  07/04/2020, 3:15 PM  [facility] [address] [place], [place], [address] Phone: [phone]   Fax:  [phone]  Name: ELZABETH MCQUERRY MRN: [id number] Date of Birth: Oct 01, 1965

## 2020-07-04 NOTE — Therapy (Signed)
[facility] [address] [place], [place], [address] Phone: [phone]   Fax:  [phone]  Physical Therapy Treatment  Patient Details  Name: Melissa Meyers MRN: [id number] Date of Birth: 22-Jun-1965 Referring Provider (PT): Gertha Calkin   Encounter Date: 07/04/2020   PT End of Session - 07/04/20 1424    Visit Number 6    Number of Visits 15    Date for PT Re-Evaluation 08/01/20    Authorization Type VA, 15 visits approved    Authorization - Visit Number 6    Authorization - Number of Visits 15    Progress Note Due on Visit 10    PT Start Time 1430    PT Stop Time 1512    PT Time Calculation (min) 42 min    Activity Tolerance Patient tolerated treatment well    Behavior During Therapy [facility] for tasks assessed/performed           Past Medical History:  Diagnosis Date  . Acid reflux   . Sinus drainage   . Thyroid disease     Past Surgical History:  Procedure Laterality Date  . TUBAL LIGATION      There were no vitals filed for this visit.   Subjective Assessment - 07/04/20 1430    Subjective Patient says her knee is doing ok. Squats are still difficult, but it feels like it is loosening up some.    Currently in Pain? Yes    Pain Score 4     Pain Location Knee    Pain Orientation Left    Pain Descriptors / Indicators Aching    Pain Onset More than a month ago                             [facility] Adult PT Treatment/Exercise - 07/04/20 1432      Knee/Hip Exercises: Stretches   Knee: Self-Stretch to increase Flexion Left;5 reps;10 seconds    Knee: Self-Stretch Limitations knee drive on 12 inch box    Gastroc Stretch Both;3 reps;30 seconds    Gastroc Stretch Limitations slant board      Knee/Hip Exercises: Machines for Strengthening   Cybex Knee Flexion 2 x 10 45# DL, 1 x 10 38# SL on LT    Cybex Leg Press DL 75# 3 x 10    Other Machine machine walkout 40# x 10      Knee/Hip Exercises: Standing    Heel Raises Both;2 sets;10 reps    Heel Raises Limitations from incline    Forward Step Up Left;1 set;15 reps;Hand Hold: 1;Step Height: 6"    Step Down Left;1 set;15 reps;Hand Hold: 1;Step Height: 6"    Functional Squat 1 set;10 reps    Other Standing Knee Exercises band sidestepping RTB 3 RT                    PT Short Term Goals - 06/13/20 1357      PT SHORT TERM GOAL #1   Title Patient will be independent and consistent with preliminary HEP for LE strength    Time 3    Period Weeks    Status New    Target Date 07/04/20      PT SHORT TERM GOAL #2   Title Patient will report at least 25% improvement in symptoms for improved quality of life.    Time 3    Period Weeks    Status  New    Target Date 07/04/20      PT SHORT TERM GOAL #3   Title Patient will be able to ambulate at least * feet in in order to demonstrate improved gait speed for community ambulation.    Baseline 260 ft    Time 3    Period Weeks    Status New    Target Date 07/04/20      PT SHORT TERM GOAL #4   Title --    Baseline --    Time --    Period --    Status --    Target Date --             PT Long Term Goals - 06/13/20 1400      PT LONG TERM GOAL #1   Title Patient will demonstrate left knee flexion to 115 degrees to improve symmetry with sit-stand    Baseline 100 degrees left knee flexion    Time 7    Period Weeks    Status New    Target Date 08/01/20      PT LONG TERM GOAL #2   Title Patient will report left knee pain not exceeding 2/10 with functional squat    Baseline 5/10 to 75 degrees hip flexion    Time 7    Period Weeks    Status New    Target Date 08/01/20      PT LONG TERM GOAL #3   Title Patient will be able to bicycle x 15 minutes without pain to facilitate return to activity    Baseline unable    Time 7    Period Weeks    Status New    Target Date 08/01/20                 Plan - 07/04/20 1630    Clinical Impression Statement progressed to  eccentric step downs for improved quad control and LE strengthening. Also added hamstring curl machine for improved knee strength and function. Patient able to perform partial range functional squat to about 50% depth with little to no pain. Patient will continue to benefit from skilled therapy services to progress knee strength and stabilization activity for reduced pain and improved functional mobility.    Personal Factors and Comorbidities Comorbidity 1;Time since onset of injury/illness/exacerbation    Comorbidities left hand injury    Examination-Activity Limitations Bend;Carry;Stairs;Squat;Locomotion Level;Stand    Examination-Participation Restrictions Community Activity;Other   exercise, bike riding   Stability/Clinical Decision Making Stable/Uncomplicated    Rehab Potential Good    PT Frequency 2x / week    PT Duration --   7 weeks   PT Treatment/Interventions ADLs/Self Care Home Management;Aquatic Therapy;Cryotherapy;Electrical Stimulation;Ultrasound;Traction;Moist Heat;DME Instruction;Gait training;Stair training;Functional mobility training;Therapeutic activities;Therapeutic exercise;Patient/family education;Neuromuscular re-education;Balance training;Manual techniques;Taping;Energy conservation;Dry needling;Passive range of motion;Spinal Manipulations;Joint Manipulations    PT Next Visit Plan Continue with strengthening with emphasis on left VMO, squat mechanics. Add monster walks next visit    PT Home Exercise Plan QS, SLR, mini-squats from elevated surface. OKC PRE with red t-loop, heel raise, standing hip abduction 1/19 band hip abduction/ extension, band sidestepping; 1/24 heel slides, LAQs    Consulted and Agree with Plan of Care Patient           Patient will benefit from skilled therapeutic intervention in order to improve the following deficits and impairments:  Abnormal gait,Decreased activity tolerance,Decreased endurance,Decreased range of motion,Difficulty walking,Decreased  strength,Impaired flexibility,Pain  Visit Diagnosis: Chronic pain of left knee  Difficulty  in walking, not elsewhere classified     Problem List There are no problems to display for this patient.   4:32 PM, 07/04/20 Georges Lynch PT DPT  Physical Therapist with [facility]  [facility]  [phone]   [facility] [address] [place], [place], [address] Phone: [phone]   Fax:  [phone]  Name: ELICA ALMAS MRN: [id number] Date of Birth: [birth year]

## 2020-07-09 ENCOUNTER — Encounter (HOSPITAL_COMMUNITY): Payer: Self-pay | Admitting: Physical Therapy

## 2020-07-09 ENCOUNTER — Ambulatory Visit (HOSPITAL_COMMUNITY): Payer: No Typology Code available for payment source | Admitting: Physical Therapy

## 2020-07-09 ENCOUNTER — Encounter (HOSPITAL_COMMUNITY): Payer: Self-pay

## 2020-07-09 ENCOUNTER — Ambulatory Visit (HOSPITAL_COMMUNITY): Payer: No Typology Code available for payment source

## 2020-07-09 ENCOUNTER — Other Ambulatory Visit: Payer: Self-pay

## 2020-07-09 DIAGNOSIS — M25562 Pain in left knee: Secondary | ICD-10-CM

## 2020-07-09 DIAGNOSIS — R29898 Other symptoms and signs involving the musculoskeletal system: Secondary | ICD-10-CM

## 2020-07-09 DIAGNOSIS — M79642 Pain in left hand: Secondary | ICD-10-CM

## 2020-07-09 DIAGNOSIS — R262 Difficulty in walking, not elsewhere classified: Secondary | ICD-10-CM

## 2020-07-09 DIAGNOSIS — R278 Other lack of coordination: Secondary | ICD-10-CM

## 2020-07-09 DIAGNOSIS — M25642 Stiffness of left hand, not elsewhere classified: Secondary | ICD-10-CM

## 2020-07-09 DIAGNOSIS — G8929 Other chronic pain: Secondary | ICD-10-CM

## 2020-07-09 DIAGNOSIS — M2560 Stiffness of unspecified joint, not elsewhere classified: Secondary | ICD-10-CM

## 2020-07-09 NOTE — Therapy (Signed)
[facility] [address] [place], [place], [address] Phone: [phone]   Fax:  [phone]  Physical Therapy Treatment  Patient Details  Name: Melissa Meyers MRN: [id number] Date of Birth: 06-03-1966 Referring Provider (PT): Gertha Calkin   Encounter Date: 07/09/2020   PT End of Session - 07/09/20 1540    Visit Number 7    Number of Visits 15    Date for PT Re-Evaluation 08/01/20    Authorization Type VA, 15 visits approved    Authorization - Visit Number 7    Authorization - Number of Visits 15    Progress Note Due on Visit 10    PT Start Time 1520    PT Stop Time 1600    PT Time Calculation (min) 40 min    Activity Tolerance Patient tolerated treatment well    Behavior During Therapy [facility] for tasks assessed/performed           Past Medical History:  Diagnosis Date  . Acid reflux   . Sinus drainage   . Thyroid disease     Past Surgical History:  Procedure Laterality Date  . TUBAL LIGATION      There were no vitals filed for this visit.   Subjective Assessment - 07/09/20 1525    Subjective Patient says her knee is feeling pretty good today but the pain is still at a 4.    How long can you stand comfortably? 15 min    How long can you walk comfortably? 15-20 min    Patient Stated Goals Be able to ride a bike again    Currently in Pain? Yes    Pain Score 4     Pain Location Knee    Pain Orientation Left;Anterior    Pain Descriptors / Indicators Aching    Pain Onset More than a month ago                             [facility] Adult PT Treatment/Exercise - 07/09/20 1528      Exercises   Exercises Knee/Hip      Knee/Hip Exercises: Stretches   Passive Hamstring Stretch Left;2 reps;30 seconds    Quad Stretch Left;3 reps;10 seconds    Knee: Self-Stretch to increase Flexion Left;5 reps;20 seconds    Gastroc Stretch Both;3 reps;30 seconds    Gastroc Stretch Limitations slant board      Knee/Hip  Exercises: Machines for Strengthening   Cybex Knee Flexion 5 x w.5 plates; decreased 4 PL x 10   5 pl caused increased pain   Cybex Leg Press 5 Pl x 15      Knee/Hip Exercises: Standing   Heel Raises Both;15 reps    Knee Flexion Left;15 reps    Knee Flexion Limitations 4    Forward Step Up Left;1 set;15 reps;Step Height: 6"    Step Down Left;1 set;15 reps;Hand Hold: 1;Step Height: 6"    Functional Squat 15 reps    Rocker Board 2 minutes    SLS with Vectors 20" x 1; 10" x 1   10" still caused increase pain decrease to 5" next visit.   Other Standing Knee Exercises t'band red monster walk x 1 RT      Hand Exercises   Sponges 10,14    Other Hand Exercises Using tweezers to focus on DIP flexion of index finger, patient picked up sponge sponges and transfered from right to left side of  body.      Modalities   Modalities Ultrasound      Ultrasound   Ultrasound Goals Edema   decrease scar tissue                   PT Short Term Goals - 06/13/20 1357      PT SHORT TERM GOAL #1   Title Patient will be independent and consistent with preliminary HEP for LE strength    Time 3    Period Weeks    Status New    Target Date 07/04/20      PT SHORT TERM GOAL #2   Title Patient will report at least 25% improvement in symptoms for improved quality of life.    Time 3    Period Weeks    Status New    Target Date 07/04/20      PT SHORT TERM GOAL #3   Title Patient will be able to ambulate at least * feet in in order to demonstrate improved gait speed for community ambulation.    Baseline 260 ft    Time 3    Period Weeks    Status New    Target Date 07/04/20      PT SHORT TERM GOAL #4   Title --    Baseline --    Time --    Period --    Status --    Target Date --             PT Long Term Goals - 06/13/20 1400      PT LONG TERM GOAL #1   Title Patient will demonstrate left knee flexion to 115 degrees to improve symmetry with sit-stand    Baseline 100 degrees  left knee flexion    Time 7    Period Weeks    Status New    Target Date 08/01/20      PT LONG TERM GOAL #2   Title Patient will report left knee pain not exceeding 2/10 with functional squat    Baseline 5/10 to 75 degrees hip flexion    Time 7    Period Weeks    Status New    Target Date 08/01/20      PT LONG TERM GOAL #3   Title Patient will be able to bicycle x 15 minutes without pain to facilitate return to activity    Baseline unable    Time 7    Period Weeks    Status New    Target Date 08/01/20                 Plan - 07/09/20 1541    Clinical Impression Statement Added wt and reps to pt program.  Pt had not complaints of increased pain throughout exercises.  Pt  tends to go into pain with exercises without notifiying therapist.  Therapist stressed the importance of completing exercises painfree and to let us know if she is having increaed pain so that exercises can be modified.    Personal Factors and Comorbidities Comorbidity 1;Time since onset of injury/illness/exacerbation    Comorbidities left hand injury    Examination-Activity Limitations Bend;Carry;Stairs;Squat;Locomotion Level;Stand    Examination-Participation Restrictions Community Activity;Other   exercise, bike riding   Stability/Clinical Decision Making Stable/Uncomplicated    Rehab Potential Good    PT Frequency 2x / week    PT Duration --   7 weeks   PT Treatment/Interventions ADLs/Self Care Home Management;Aquatic Therapy;Cryotherapy;Electrical Stimulation;Ultrasound;Traction;Moist Heat;DME Instruction;Gait training;Stair  training;Functional mobility training;Therapeutic activities;Therapeutic exercise;Patient/family education;Neuromuscular re-education;Balance training;Manual techniques;Taping;Energy conservation;Dry needling;Passive range of motion;Spinal Manipulations;Joint Manipulations    PT Next Visit Plan Continue with strengthening with emphasis on left VMO, squat mechanics.    PT Home  Exercise Plan QS, SLR, mini-squats from elevated surface. OKC PRE with red t-loop, heel raise, standing hip abduction 1/19 band hip abduction/ extension, band sidestepping; 1/24 heel slides, LAQs    Consulted and Agree with Plan of Care Patient           Patient will benefit from skilled therapeutic intervention in order to improve the following deficits and impairments:  Abnormal gait,Decreased activity tolerance,Decreased endurance,Decreased range of motion,Difficulty walking,Decreased strength,Impaired flexibility,Pain  Visit Diagnosis: Chronic pain of left knee  Difficulty in walking, not elsewhere classified  Reduced active range of joint movement     Problem List There are no problems to display for this patient. Virgina Organ, PT CLT [phone] 07/09/2020, 4:08 PM  [facility] [address] [place], [place], [address] Phone: [phone]   Fax:  [phone]  Name: Melissa Meyers MRN: [id number] Date of Birth: 03/28/1966

## 2020-07-09 NOTE — Therapy (Signed)
[facility] [address] [place], [place], [address] Phone: [phone]   Fax:  [phone]  Occupational Therapy Treatment  Patient Details  Name: Melissa Meyers MRN: [id number] Date of Birth: 1966-02-01 Referring Provider (OT): Gertha Calkin, MD (Hand Surgeon: Gerri Lins, MD (Route progress notes to him as well. ))   Encounter Date: 07/09/2020   OT End of Session - 07/09/20 1502    Visit Number 14    Number of Visits 24    Date for OT Re-Evaluation 08/14/20    Authorization Type VA has authorized 15 visits at this time.    Authorization - Visit Number 14    Authorization - Number of Visits 15    OT Start Time 1430    OT Stop Time 1510    OT Time Calculation (min) 40 min    Activity Tolerance Patient tolerated treatment well    Behavior During Therapy WFL for tasks assessed/performed           Past Medical History:  Diagnosis Date  . Acid reflux   . Sinus drainage   . Thyroid disease     Past Surgical History:  Procedure Laterality Date  . TUBAL LIGATION      There were no vitals filed for this visit.   Subjective Assessment - 07/09/20 1453    Subjective  S: I see the doctor on Friday.    Currently in Pain? Yes    Pain Score 4     Pain Location Hand    Pain Orientation Left    Pain Descriptors / Indicators Aching;Sore    Pain Type Acute pain    Pain Onset 1 to 4 weeks ago    Pain Frequency Constant    Aggravating Factors  increased use    Pain Relieving Factors nothing at this time.    Effect of Pain on Daily Activities mod effect              [facility] OT Assessment - 07/09/20 1454      Assessment   Medical Diagnosis left index finger tendon repair      Precautions   Precautions Other (comment)    Precaution Comments follow protocol for flexor tendon repair via [place] hand protocol book. Week 10-12 postop: The hand may be used for all ADL tasks. No lifting greater than 5lbs. Continue to refrain from  attempting a tight sustained grip against resistance                    OT Treatments/Exercises (OP) - 07/09/20 1454      Exercises   Exercises Hand;Theraputty      Hand Exercises   Sponges 10,14    Other Hand Exercises Using tweezers to focus on DIP flexion of index finger, patient picked up sponge sponges and transfered from right to left side of body.      Modalities   Modalities Ultrasound      Ultrasound   Ultrasound Location palm aspect of 2nd digit from PIP to MCP joint then MCP joint of 2nd digit    Ultrasound Parameters 1.5 W/cm2    Ultrasound Goals Edema   decrease scar tissue     Fine Motor Coordination (Hand/Wrist)   Fine Motor Coordination Small Pegboard    Small Pegboard Utilized tweezers in left hand using a tripod grasp (focusing on use of thumb and index finger) to pick up pegs and recreate pattern on pegboard  OT Short Term Goals - 05/24/20 1658      OT SHORT TERM GOAL #1   Title Patient will be educated and independent with HEP in order to safely mobilize her left hand in order to decrease risk of contracture and increase functional ROM.    Time 6    Period Months    Status On-going    Target Date 07/05/20      OT SHORT TERM GOAL #2   Title Patient will improve LUE forearm, wrist, and hand P/ROM to [facility] for improved functional use of LUE.    Time 6    Period Weeks    Status On-going      OT SHORT TERM GOAL #3   Title Patient will report a decreased pain level of 3/10 when completing HEP and mobility exercises with left UE.    Time 6    Period Weeks    Status On-going      OT SHORT TERM GOAL #4   Title Pt will decrease left hand fascial restrictions and increase scar mobility in order to increase ability to form a full fist.    Time 6    Period Weeks    Status On-going             OT Long Term Goals - 05/24/20 1658      OT LONG TERM GOAL #1   Title Patient will return to prior level of function using  her LUE as dominant with daily tasks.    Time 12    Period Weeks    Status On-going      OT LONG TERM GOAL #2   Title Patient will improve LUE A/ROM to WNL for improved ability to drive, and complete desired daily tasks at 75% or more of the time.    Time 12    Period Weeks    Status On-going      OT LONG TERM GOAL #3   Title Patient will improve LUE grip and pinch strength to [facility] in order to open up jars and containers.    Time 12    Period Weeks    Status On-going      OT LONG TERM GOAL #4   Title Patient will increase her fine motor coordination of her left hand while completing the nine hole peg test in 30" or less in the standardized fashion in order to return to utlizing her left hand for all desired coordination tasks.    Time 12    Period Weeks    Status On-going                 Plan - 07/09/20 1502    Clinical Impression Statement A: Continued with use of [place] at start to decrease edema and scar tissue while extending treatment area to MCP joint of middle finger due to reports of pain in area. When blocking MCP joint, patient is able to demonstrate some DIP flexion although minimal. Able to demonstrate more consistant muscle activation this session versus previsou session when it was brieft and not consistent. Provided X-small edema glove this session as small glove is not providing enough compression at night. Pt reports she wakes up and her hand still feels swollen.    Body Structure / Function / Physical Skills ADL;Strength;Pain;GMC;Dexterity;Edema;UE functional use;ROM;IADL;Fascial restriction;Scar mobility;Wound;Coordination;FMC;Skin integrity;Decreased knowledge of precautions;Flexibility    Plan P: Take measurements for MD appointment and request additional visit from [place].    Consulted and Agree with Plan of Care  Patient           Patient will benefit from skilled therapeutic intervention in order to improve the following deficits and impairments:   Body  Structure / Function / Physical Skills: ADL,Strength,Pain,GMC,Dexterity,Edema,UE functional use,ROM,IADL,Fascial restriction,Scar mobility,Wound,Coordination,FMC,Skin integrity,Decreased knowledge of precautions,Flexibility       Visit Diagnosis: Pain in left hand  Stiffness of left hand, not elsewhere classified  Other symptoms and signs involving the musculoskeletal system  Other lack of coordination    Problem List There are no problems to display for this patient.  Limmie Patricia, OTR/L,CBIS  [phone]   07/09/2020, 3:41 PM  [place]  [facility] [address] [place], [place], [address] Phone: [phone]   Fax:  [phone]  Name: Melissa Meyers MRN: [id number] Date of Birth: [birth year]

## 2020-07-11 ENCOUNTER — Ambulatory Visit (HOSPITAL_COMMUNITY): Payer: No Typology Code available for payment source | Admitting: Physical Therapy

## 2020-07-11 ENCOUNTER — Telehealth (HOSPITAL_COMMUNITY): Payer: Self-pay

## 2020-07-11 ENCOUNTER — Encounter (HOSPITAL_COMMUNITY): Payer: Self-pay | Admitting: Physical Therapy

## 2020-07-11 ENCOUNTER — Ambulatory Visit (HOSPITAL_COMMUNITY): Payer: No Typology Code available for payment source | Attending: Internal Medicine

## 2020-07-11 ENCOUNTER — Other Ambulatory Visit: Payer: Self-pay

## 2020-07-11 DIAGNOSIS — R29898 Other symptoms and signs involving the musculoskeletal system: Secondary | ICD-10-CM | POA: Insufficient documentation

## 2020-07-11 DIAGNOSIS — M2560 Stiffness of unspecified joint, not elsewhere classified: Secondary | ICD-10-CM | POA: Insufficient documentation

## 2020-07-11 DIAGNOSIS — R278 Other lack of coordination: Secondary | ICD-10-CM | POA: Diagnosis not present

## 2020-07-11 DIAGNOSIS — G8929 Other chronic pain: Secondary | ICD-10-CM | POA: Diagnosis present

## 2020-07-11 DIAGNOSIS — R262 Difficulty in walking, not elsewhere classified: Secondary | ICD-10-CM | POA: Insufficient documentation

## 2020-07-11 DIAGNOSIS — M25642 Stiffness of left hand, not elsewhere classified: Secondary | ICD-10-CM | POA: Insufficient documentation

## 2020-07-11 DIAGNOSIS — M25562 Pain in left knee: Secondary | ICD-10-CM | POA: Insufficient documentation

## 2020-07-11 DIAGNOSIS — M79642 Pain in left hand: Secondary | ICD-10-CM | POA: Diagnosis present

## 2020-07-11 NOTE — Therapy (Signed)
[place], [place], [address] Phone: [phone]   Fax:  [phone]  Occupational Therapy Treatment  Patient Details  Name: Melissa Meyers MRN: [id number] Date of Birth: 1965-11-02 Referring Provider (OT): Marsh Dolly, MD (Hand Surgeon: Jettie Pagan, MD (Route progress notes to him as well. ))   Encounter Date: 07/11/2020   OT End of Session - 07/11/20 1636    Visit Number 15    Number of Visits 24    Date for OT Re-Evaluation 08/14/20    Authorization Type VA has authorized 15 visits at this time. 07/11/20: requesting 9 visits from the [place].    Authorization - Visit Number 15    Authorization - Number of Visits 15    OT Start Time 1350    OT Stop Time 1430    OT Time Calculation (min) 40 min    Activity Tolerance Patient tolerated treatment well    Behavior During Therapy WFL for tasks assessed/performed           Past Medical History:  Diagnosis Date  . Acid reflux   . Sinus drainage   . Thyroid disease     Past Surgical History:  Procedure Laterality Date  . TUBAL LIGATION      There were no vitals filed for this visit.       [facility] OT Assessment - 07/11/20 1354      Assessment   Medical Diagnosis left index finger tendon repair      Precautions   Precautions Other (comment)    Precaution Comments follow protocol for flexor tendon repair via [place] hand protocol book. Week 10-12 postop: The hand may be used for all ADL tasks. No lifting greater than 5lbs. Continue to refrain from attempting a tight sustained grip against resistance      Coordination   Left 9 Hole Peg Test 24.1"   previous: 27.5"     ROM / Strength   AROM / PROM / Strength Strength      AROM   Left Wrist Extension 58 Degrees   previous: 44   Left Wrist Flexion 74 Degrees   previous: 60     Strength   Overall Strength Comments hand strength assessed for the first time this date.    Strength Assessment Site Hand     Right/Left hand Left    Left Hand Grip (lbs) 25   previous: not tested   Left Hand Lateral Pinch 11 lbs   previous: Not tested   Left Hand 3 Point Pinch 7 lbs   previous: not tested     Left Hand AROM   L Thumb MCP 0-60 --   WNL   L Thumb IP 0-80 78 Degrees   previous: 62   L Index  MCP 0-90 76 Degrees   previous: 68   L Index PIP 0-100 52 Degrees   previous: 42   L Index DIP 0-70 0 Degrees   previous: 0   L Long  MCP 0-90 78 Degrees   previous: 64   L Long PIP 0-100 86 Degrees   previous: 76   L Long DIP 0-70 62 Degrees   previous: 64   L Ring  MCP 0-90 74 Degrees   previous: 64   L Ring PIP 0-100 90 Degrees   previous: same   L Ring DIP 0-70 68 Degrees   previous: 60   L Little  MCP 0-90 80 Degrees   previous:66  L Little PIP 0-100 --   WNL   L Little DIP 0-70 --   WNL     Left Hand PROM   L Index  MCP 0-90 80 Degrees    L Index PIP 0-100 76 Degrees              Quick Dash - 07/11/20 1419    Open a tight or new jar Unable    Do heavy household chores (wash walls, wash floors) Moderate difficulty    Carry a shopping bag or briefcase Moderate difficulty    Wash your back Mild difficulty    Use a knife to cut food Moderate difficulty    Recreational activities in which you take some force or impact through your arm, shoulder, or hand (golf, hammering, tennis) Severe difficulty    During the past week, to what extent has your arm, shoulder or hand problem interfered with your normal social activities with family, friends, neighbors, or groups? Not at all    During the past week, to what extent has your arm, shoulder or hand problem limited your work or other regular daily activities Slightly    Arm, shoulder, or hand pain. Moderate    Tingling (pins and needles) in your arm, shoulder, or hand Moderate    Difficulty Sleeping No difficulty    DASH Score 43.18 %                        OT Education - 07/11/20 1636    Education Details reviewed progress in  therapy and therapy goals.    Person(s) Educated Patient    Methods Explanation    Comprehension Verbalized understanding            OT Short Term Goals - 07/11/20 1410      OT SHORT TERM GOAL #1   Title Patient will be educated and independent with HEP in order to safely mobilize her left hand in order to decrease risk of contracture and increase functional ROM.    Time 6    Period Weeks    Status Achieved    Target Date 07/05/20      OT SHORT TERM GOAL #2   Title Patient will improve LUE forearm, wrist, and hand P/ROM to [facility] for improved functional use of LUE.    Time 6    Period Weeks    Status Achieved      OT SHORT TERM GOAL #3   Title Patient will report a decreased pain level of 3/10 when completing HEP and mobility exercises with left UE.    Time 6    Period Weeks    Status Achieved      OT SHORT TERM GOAL #4   Title Pt will decrease left hand fascial restrictions and increase scar mobility in order to increase ability to form a full fist.    Time 6    Period Weeks    Status Partially Met             OT Long Term Goals - 07/11/20 1418      OT LONG TERM GOAL #1   Title Patient will return to prior level of function using her LUE as dominant with daily tasks.    Time 12    Period Weeks    Status On-going      OT LONG TERM GOAL #2   Title Patient will improve LUE A/ROM to WNL for improved ability to drive, and  complete desired daily tasks at 75% or more of the time.    Time 12    Period Weeks    Status On-going      OT LONG TERM GOAL #3   Title Patient will improve LUE grip and pinch strength to [facility] in order to open up jars and containers.    Time 12    Period Weeks    Status On-going      OT LONG TERM GOAL #4   Title Patient will increase her fine motor coordination of her left hand while completing the nine hole peg test in 30" or less in the standardized fashion in order to return to utlizing her left hand for all desired coordination tasks.     Time 12    Period Weeks    Status Achieved                 Plan - 07/11/20 1637    Clinical Impression Statement A: Measurements taken for MD appointment. Patient has made improvement with A/ROM of her left hand. Able to measure grip and pinch strength this session. Patient has met 3/4 STGs at this time. She is progressing towards LTGs. Overall, patient has shown improvement with ROM, strength, and decreasing pain since initial evaluation. She is currently presenting with difficulty achieving functional A/ROM in her left hand 2nd digit which may be caused from scar tissue. Patient has been utilizing a flexion glove to provide a prolonged stretch to left hand. She wears it multiple times a day. She is able to achieve slightly more flexion passively. She is starting to use her left hand more for light ADL tasks. Deficits remain with strength, ROM, and occassional pain with use. Pt will benefit from continuing with skilled OT services to focus on mentioned deficits.    Body Structure / Function / Physical Skills ADL;Strength;Pain;GMC;Dexterity;Edema;UE functional use;ROM;IADL;Fascial restriction;Scar mobility;Wound;Coordination;FMC;Skin integrity;Decreased knowledge of precautions;Flexibility    Plan P: Follow up on MD appointment. Check on additional visits request from [place].    Consulted and Agree with Plan of Care Patient           Patient will benefit from skilled therapeutic intervention in order to improve the following deficits and impairments:   Body Structure / Function / Physical Skills: ADL,Strength,Pain,GMC,Dexterity,Edema,UE functional use,ROM,IADL,Fascial restriction,Scar mobility,Wound,Coordination,FMC,Skin integrity,Decreased knowledge of precautions,Flexibility       Visit Diagnosis: Other lack of coordination  Other symptoms and signs involving the musculoskeletal system  Stiffness of left hand, not elsewhere classified  Pain in left hand    Problem List There  are no problems to display for this patient.  Ailene Ravel, OTR/L,CBIS  [phone]  07/11/2020, 4:43 PM  [facility] [address] [place], [place], [address] Phone: [phone]   Fax:  [phone]  Name: Melissa Meyers MRN: [id number] Date of Birth: [birth year]

## 2020-07-11 NOTE — Patient Instructions (Signed)
Access Code: [email or web address] URL: [email or web address] Date: 07/11/2020 Prepared by: Georges Lynch  Exercises Prone Quadriceps Stretch with Strap - 2-3 x daily - 7 x weekly - 1 sets - 3 reps - 30 second hold

## 2020-07-11 NOTE — Telephone Encounter (Signed)
Pt cx'd due to having to serve on [employer]- she will call us about Wed apptments when she knows something.

## 2020-07-11 NOTE — Therapy (Signed)
[facility] [address] [place], [place], [address] Phone: [phone]   Fax:  [phone]  Physical Therapy Treatment  Patient Details  Name: Melissa Meyers MRN: [id number] Date of Birth: 11/26/1965 Referring Provider (PT): Gertha Calkin   Encounter Date: 07/11/2020   PT End of Session - 07/11/20 1437    Visit Number 8    Number of Visits 15    Date for PT Re-Evaluation 08/01/20    Authorization Type VA, 15 visits approved    Authorization - Visit Number 8    Authorization - Number of Visits 15    Progress Note Due on Visit 10    PT Start Time 1433    PT Stop Time 1515    PT Time Calculation (min) 42 min    Activity Tolerance Patient tolerated treatment well    Behavior During Therapy [facility] for tasks assessed/performed           Past Medical History:  Diagnosis Date  . Acid reflux   . Sinus drainage   . Thyroid disease     Past Surgical History:  Procedure Laterality Date  . TUBAL LIGATION      There were no vitals filed for this visit.   Subjective Assessment - 07/11/20 1434    Subjective Patient says she is doing well. It has not been hurting as much. Still feels it in the joint.    How long can you stand comfortably? 15 min    How long can you walk comfortably? 15-20 min    Patient Stated Goals Be able to ride a bike again    Currently in Pain? Yes    Pain Score 3     Pain Location Knee    Pain Orientation Left    Pain Descriptors / Indicators Aching    Pain Type Acute pain    Pain Onset More than a month ago    Pain Frequency Constant                             [facility] Adult PT Treatment/Exercise - 07/11/20 0001      Knee/Hip Exercises: Stretches   Quad Stretch Both;2 reps;30 seconds    Quad Stretch Limitations with strap    Gastroc Stretch Both;3 reps;30 seconds    Gastroc Stretch Limitations slant board      Knee/Hip Exercises: Machines for Strengthening   Cybex Leg Press 5 Pl 2 x 10       Knee/Hip Exercises: Standing   Heel Raises Both;20 reps    Forward Step Up Left;1 set;15 reps;Step Height: 6"   with power ups   Step Down Left;2 sets;10 reps;Hand Hold: 1;Step Height: 4"   forward heel tap down   Functional Squat 2 sets;10 reps    SLS 2 x 30" foam    SLS with Vectors 3 x 5" hold on foam    Other Standing Knee Exercises band sidestepping RTB 2 RT, monster walks RTB x 2RT                    PT Short Term Goals - 06/13/20 1357      PT SHORT TERM GOAL #1   Title Patient will be independent and consistent with preliminary HEP for LE strength    Time 3    Period Weeks    Status New    Target Date 07/04/20      PT  SHORT TERM GOAL #2   Title Patient will report at least 25% improvement in symptoms for improved quality of life.    Time 3    Period Weeks    Status New    Target Date 07/04/20      PT SHORT TERM GOAL #3   Title Patient will be able to ambulate at least * feet in in order to demonstrate improved gait speed for community ambulation.    Baseline 260 ft    Time 3    Period Weeks    Status New    Target Date 07/04/20      PT SHORT TERM GOAL #4   Title --    Baseline --    Time --    Period --    Status --    Target Date --             PT Long Term Goals - 06/13/20 1400      PT LONG TERM GOAL #1   Title Patient will demonstrate left knee flexion to 115 degrees to improve symmetry with sit-stand    Baseline 100 degrees left knee flexion    Time 7    Period Weeks    Status New    Target Date 08/01/20      PT LONG TERM GOAL #2   Title Patient will report left knee pain not exceeding 2/10 with functional squat    Baseline 5/10 to 75 degrees hip flexion    Time 7    Period Weeks    Status New    Target Date 08/01/20      PT LONG TERM GOAL #3   Title Patient will be able to bicycle x 15 minutes without pain to facilitate return to activity    Baseline unable    Time 7    Period Weeks    Status New    Target Date  08/01/20                 Plan - 07/11/20 1516    Clinical Impression Statement Patient showing steady improvement. Patient with ongoing tightness in LT knee with squats but less pain. Added prone quad stretching and noted significant difference in LT quad flexibility. Added this for HEP. Patient noted increased squat depth and decreased tension in LT knee squatting. Patient will continue to benefit from skilled therapy services to progress knee strength and mobility for reduced pain and improved functional mobility.    Personal Factors and Comorbidities Comorbidity 1;Time since onset of injury/illness/exacerbation    Comorbidities left hand injury    Examination-Activity Limitations Bend;Carry;Stairs;Squat;Locomotion Level;Stand    Examination-Participation Restrictions Community Activity;Other   exercise, bike riding   Stability/Clinical Decision Making Stable/Uncomplicated    Rehab Potential Good    PT Frequency 2x / week    PT Duration --   7 weeks   PT Treatment/Interventions ADLs/Self Care Home Management;Aquatic Therapy;Cryotherapy;Electrical Stimulation;Ultrasound;Traction;Moist Heat;DME Instruction;Gait training;Stair training;Functional mobility training;Therapeutic activities;Therapeutic exercise;Patient/family education;Neuromuscular re-education;Balance training;Manual techniques;Taping;Energy conservation;Dry needling;Passive range of motion;Spinal Manipulations;Joint Manipulations    PT Next Visit Plan Continue with strengthening with emphasis on left VMO, squat mechanics.    PT Home Exercise Plan QS, SLR, mini-squats from elevated surface. [place] PRE with red t-loop, heel raise, standing hip abduction 1/19 band hip abduction/ extension, band sidestepping; 1/24 heel slides, LAQs 2/2 prone quad stretch    Consulted and Agree with Plan of Care Patient           Patient will  benefit from skilled therapeutic intervention in order to improve the following deficits and impairments:   Abnormal gait,Decreased activity tolerance,Decreased endurance,Decreased range of motion,Difficulty walking,Decreased strength,Impaired flexibility,Pain  Visit Diagnosis: Chronic pain of left knee  Difficulty in walking, not elsewhere classified  Reduced active range of joint movement     Problem List There are no problems to display for this patient.   3:20 PM, 07/11/20 Georges Lynch PT DPT  Physical Therapist with [facility]  [facility]  [phone]   [facility] [address] [place], [place], [address] Phone: [phone]   Fax:  [phone]  Name: Melissa Meyers MRN: [id number] Date of Birth: Feb 28, 1966

## 2020-07-16 ENCOUNTER — Encounter (HOSPITAL_COMMUNITY): Payer: No Typology Code available for payment source | Admitting: Physical Therapy

## 2020-07-16 ENCOUNTER — Ambulatory Visit (HOSPITAL_COMMUNITY): Payer: No Typology Code available for payment source

## 2020-07-18 ENCOUNTER — Encounter (HOSPITAL_COMMUNITY): Payer: Self-pay

## 2020-07-18 ENCOUNTER — Other Ambulatory Visit: Payer: Self-pay

## 2020-07-18 ENCOUNTER — Ambulatory Visit (HOSPITAL_COMMUNITY): Payer: No Typology Code available for payment source

## 2020-07-18 ENCOUNTER — Telehealth (HOSPITAL_COMMUNITY): Payer: Self-pay

## 2020-07-18 ENCOUNTER — Encounter (HOSPITAL_COMMUNITY): Payer: Self-pay | Admitting: Physical Therapy

## 2020-07-18 ENCOUNTER — Ambulatory Visit (HOSPITAL_COMMUNITY): Payer: No Typology Code available for payment source | Admitting: Physical Therapy

## 2020-07-18 DIAGNOSIS — R29898 Other symptoms and signs involving the musculoskeletal system: Secondary | ICD-10-CM

## 2020-07-18 DIAGNOSIS — M25642 Stiffness of left hand, not elsewhere classified: Secondary | ICD-10-CM

## 2020-07-18 DIAGNOSIS — R278 Other lack of coordination: Secondary | ICD-10-CM | POA: Diagnosis not present

## 2020-07-18 DIAGNOSIS — M79642 Pain in left hand: Secondary | ICD-10-CM

## 2020-07-18 DIAGNOSIS — R262 Difficulty in walking, not elsewhere classified: Secondary | ICD-10-CM

## 2020-07-18 DIAGNOSIS — M2560 Stiffness of unspecified joint, not elsewhere classified: Secondary | ICD-10-CM

## 2020-07-18 DIAGNOSIS — G8929 Other chronic pain: Secondary | ICD-10-CM

## 2020-07-18 NOTE — Therapy (Signed)
[facility] [address] [place], [place], [address] Phone: [phone]   Fax:  [phone]  Physical Therapy Treatment  Patient Details  Name: Melissa Meyers MRN: [id number] Date of Birth: May 12, 1966 Referring Provider (PT): Gertha Calkin   Encounter Date: 07/18/2020   PT End of Session - 07/18/20 1522    Visit Number 9    Number of Visits 15    Date for PT Re-Evaluation 08/01/20    Authorization Type VA, 15 visits approved    Authorization - Visit Number 9    Authorization - Number of Visits 15    Progress Note Due on Visit 10    PT Start Time 1518    PT Stop Time 1600    PT Time Calculation (min) 42 min    Activity Tolerance Patient tolerated treatment well    Behavior During Therapy [facility] for tasks assessed/performed           Past Medical History:  Diagnosis Date  . Acid reflux   . Sinus drainage   . Thyroid disease     Past Surgical History:  Procedure Laterality Date  . TUBAL LIGATION      There were no vitals filed for this visit.   Subjective Assessment - 07/18/20 1521    Subjective Patient states she thinks she is getting better.    How long can you stand comfortably? 15 min    How long can you walk comfortably? 15-20 min    Patient Stated Goals Be able to ride a bike again    Currently in Pain? Yes    Pain Score 2     Pain Location Knee    Pain Orientation Left    Pain Descriptors / Indicators Aching    Pain Type Acute pain    Pain Onset More than a month ago    Pain Frequency Constant                             [facility] Adult PT Treatment/Exercise - 07/18/20 1522      Knee/Hip Exercises: Stretches   Gastroc Stretch Both;3 reps;30 seconds    Gastroc Stretch Limitations slant board      Knee/Hip Exercises: Machines for Strengthening   Cybex Knee Flexion 4 PL 2 x 10    Cybex Leg Press 5 Pl 2 x 10    Other Machine machine walkout 40# x 10      Knee/Hip Exercises: Standing   Heel  Raises Both;20 reps    Forward Step Up Left;Step Height: 6";2 sets;10 reps    Forward Step Up Limitations power ups    Functional Squat 2 sets;10 reps    Stairs 5 RT reciprocal gait no rails    SLS with Vectors 3 x 10" hold on foam    Other Standing Knee Exercises band sidestepping RTB 3 RT, monster walks RTB x 3RT                    PT Short Term Goals - 06/13/20 1357      PT SHORT TERM GOAL #1   Title Patient will be independent and consistent with preliminary HEP for LE strength    Time 3    Period Weeks    Status New    Target Date 07/04/20      PT SHORT TERM GOAL #2   Title Patient will report at least 25% improvement in symptoms  for improved quality of life.    Time 3    Period Weeks    Status New    Target Date 07/04/20      PT SHORT TERM GOAL #3   Title Patient will be able to ambulate at least * feet in in order to demonstrate improved gait speed for community ambulation.    Baseline 260 ft    Time 3    Period Weeks    Status New    Target Date 07/04/20      PT SHORT TERM GOAL #4   Title --    Baseline --    Time --    Period --    Status --    Target Date --             PT Long Term Goals - 06/13/20 1400      PT LONG TERM GOAL #1   Title Patient will demonstrate left knee flexion to 115 degrees to improve symmetry with sit-stand    Baseline 100 degrees left knee flexion    Time 7    Period Weeks    Status New    Target Date 08/01/20      PT LONG TERM GOAL #2   Title Patient will report left knee pain not exceeding 2/10 with functional squat    Baseline 5/10 to 75 degrees hip flexion    Time 7    Period Weeks    Status New    Target Date 08/01/20      PT LONG TERM GOAL #3   Title Patient will be able to bicycle x 15 minutes without pain to facilitate return to activity    Baseline unable    Time 7    Period Weeks    Status New    Target Date 08/01/20                 Plan - 07/18/20 1617    Clinical Impression  Statement Patient shows continued progress. Patient able to increase weight with hamstring curls and perform more reps with band sidestepping and monster walks. Patient with good form and control with machines. Patient able to perform squats and ambulate stairs with reciprocal pattern with minimal complaint. Overall improved, still limited by slight knee pain with functional activity. Patient will continue to benefit from skilled therapy to progress stabilization and balance activity for reduced pain and improved LOF with ADLs.    Personal Factors and Comorbidities Comorbidity 1;Time since onset of injury/illness/exacerbation    Comorbidities left hand injury    Examination-Activity Limitations Bend;Carry;Stairs;Squat;Locomotion Level;Stand    Examination-Participation Restrictions Community Activity;Other   exercise, bike riding   Stability/Clinical Decision Making Stable/Uncomplicated    Rehab Potential Good    PT Frequency 2x / week    PT Duration --   7 weeks   PT Treatment/Interventions ADLs/Self Care Home Management;Aquatic Therapy;Cryotherapy;Electrical Stimulation;Ultrasound;Traction;Moist Heat;DME Instruction;Gait training;Stair training;Functional mobility training;Therapeutic activities;Therapeutic exercise;Patient/family education;Neuromuscular re-education;Balance training;Manual techniques;Taping;Energy conservation;Dry needling;Passive range of motion;Spinal Manipulations;Joint Manipulations    PT Next Visit Plan Continue to progress knee stabilization and balance/ propriception. Add balance beam next visit. Increase to green resistance band when able    PT Home Exercise Plan QS, SLR, mini-squats from elevated surface. OKC PRE with red t-loop, heel raise, standing hip abduction 1/19 band hip abduction/ extension, band sidestepping; 1/24 heel slides, LAQs 2/2 prone quad stretch    Consulted and Agree with Plan of Care Patient  Patient will benefit from skilled therapeutic  intervention in order to improve the following deficits and impairments:  Abnormal gait,Decreased activity tolerance,Decreased endurance,Decreased range of motion,Difficulty walking,Decreased strength,Impaired flexibility,Pain  Visit Diagnosis: Chronic pain of left knee  Difficulty in walking, not elsewhere classified  Reduced active range of joint movement     Problem List There are no problems to display for this patient.   4:23 PM, 07/18/20 Georges Lynch PT DPT  Physical Therapist with [facility]  [facility]  [phone]   [facility] [address] [place], [place], [address] Phone: [phone]   Fax:  [phone]  Name: Melissa Meyers MRN: [id number] Date of Birth: 20-Sep-1965

## 2020-07-18 NOTE — Telephone Encounter (Signed)
16th visit need AUTH-sent request for more auths to [place] Fax:[phone] or [phone] per [place] [facility]. S/w [place] [facility] at [facility] she confirmed the req was recieved - she will send a note to the OT Triage Team to ask for them to contact us.07/18/2020 NH

## 2020-07-19 NOTE — Therapy (Signed)
[place], [place], [address] Phone: [phone]   Fax:  [phone]  Occupational Therapy Treatment  Patient Details  Name: Melissa Meyers MRN: [id number] Date of Birth: 03-Aug-1965 Referring Provider (OT): Marsh Dolly, MD (Hand Surgeon: Jettie Pagan, MD (Route progress notes to him as well. ))   Encounter Date: 07/18/2020   OT End of Session - 07/19/20 1811    Visit Number 16    Number of Visits 24    Date for OT Re-Evaluation 08/14/20    Authorization Type VA has authorized 15 visits at this time. 07/11/20: requesting 9 visits from the [place].    Authorization - Visit Number 15    Authorization - Number of Visits 15    OT Start Time 8003    OT Stop Time 1510    OT Time Calculation (min) 38 min    Activity Tolerance Patient tolerated treatment well    Behavior During Therapy WFL for tasks assessed/performed           Past Medical History:  Diagnosis Date  . Acid reflux   . Sinus drainage   . Thyroid disease     Past Surgical History:  Procedure Laterality Date  . TUBAL LIGATION      There were no vitals filed for this visit.   Subjective Assessment - 07/18/20 1442    Subjective  S: The Doctor wants to wait until 6 months before deciding to go back in and clean it out.    Currently in Pain? No/denies              [facility] OT Assessment - 07/18/20 1443      Assessment   Medical Diagnosis left index finger tendon repair    Next MD Visit 08/10/20      Precautions   Precautions Other (comment)    Precaution Comments follow protocol for flexor tendon repair via [place] hand protocol book. Week 10-12 postop: The hand may be used for all ADL tasks. No lifting greater than 5lbs. Continue to refrain from attempting a tight sustained grip against resistance                      07/18/20 0001  Exercises  Exercises Hand;Theraputty  Hand Exercises  MCPJ Flexion PROM;5 reps  PIPJ Flexion  PROM;5 reps  DIPJ Flexion PROM;5 reps  Joint Blocking Exercises P/ROM DIP, PIP, and MCP joints 10X index finger  Other Hand Exercises towel crinkle using index and middle finger only to faciliate DIP flexion  Other Hand Exercises Ping pong pattern activity completed to focus on Left DIP joint flexion  Neurological Re-education Exercises  Finger Extension Index finger flick activity completed using left hand index finger focusing on DIP extension using high resistance sponges.  Modalities  Modalities Moist Heat;Paraffin  Moist Heat Therapy  Number Minutes Moist Heat 5 Minutes  Moist Heat Location Hand  LUE Paraffin  Number Minutes Paraffin 5 Minutes  LUE Paraffin Location Hand  Comments To decrease restrictions and scar tissue  Manual Therapy  Manual Therapy Muscle Energy Technique;Soft tissue mobilization  Manual therapy comments all manual interventions performed independently of other interventions  Soft tissue mobilization sof tissue mobilzation completed after paraffin and heat to decrease fascial restrictions and scar tissue and increase joint ROM in the left hand index finger.  Muscle Energy Technique muscle energy technique completed to left hand index finger flexor and extensors to relax tone and increase joint  mobility.             OT Short Term Goals - 07/18/20 1446      OT SHORT TERM GOAL #1   Title Patient will be educated and independent with HEP in order to safely mobilize her left hand in order to decrease risk of contracture and increase functional ROM.    Time 6    Period Weeks    Target Date 07/05/20      OT SHORT TERM GOAL #2   Title Patient will improve LUE forearm, wrist, and hand P/ROM to [facility] for improved functional use of LUE.    Time 6    Period Weeks      OT SHORT TERM GOAL #3   Title Patient will report a decreased pain level of 3/10 when completing HEP and mobility exercises with left UE.    Time 6    Period Weeks      OT SHORT TERM GOAL #4    Title Pt will decrease left hand fascial restrictions and increase scar mobility in order to increase ability to form a full fist.    Time 6    Period Weeks    Status Partially Met             OT Long Term Goals - 07/19/20 1818      OT LONG TERM GOAL #1   Title Patient will return to prior level of function using her LUE as dominant with daily tasks.    Time 12    Period Weeks    Status On-going      OT LONG TERM GOAL #2   Title Patient will improve LUE A/ROM to WNL for improved ability to drive, and complete desired daily tasks at 75% or more of the time.    Time 12    Period Weeks    Status On-going      OT LONG TERM GOAL #3   Title Patient will improve LUE grip and pinch strength to [facility] in order to open up jars and containers.    Time 12    Period Weeks    Status On-going      OT LONG TERM GOAL #4   Title Patient will increase her fine motor coordination of her left hand while completing the nine hole peg test in 30" or less in the standardized fashion in order to return to utlizing her left hand for all desired coordination tasks.    Time 12    Period Weeks                 Plan - 07/19/20 1812    Clinical Impression Statement A: Paraffin and heat used at start of session prior to stretching and functional tasks. Did note some softness to scar tissue palpated during soft tissue mobilization. Focused sessions on patient's ability to complete active movement of the index finger. Continues to demonstrate minimal active DIP flexion. Passively, patient is able to achieve a loose fist although unable to hold independently and maintain complete finger flexion of the index finger.    Body Structure / Function / Physical Skills ADL;Strength;Pain;GMC;Dexterity;Edema;UE functional use;ROM;IADL;Fascial restriction;Scar mobility;Wound;Coordination;FMC;Skin integrity;Decreased knowledge of precautions;Flexibility    Plan P:Still waiting on additional visit request from [place]. Use  paraffin and heat at start of session. May try wrapping fingers in light fist with paraffin wax and heat on. Handgripper task with resistance increase if able to tolerate.    Consulted and Agree with Plan of Care  Patient           Patient will benefit from skilled therapeutic intervention in order to improve the following deficits and impairments:   Body Structure / Function / Physical Skills: ADL,Strength,Pain,GMC,Dexterity,Edema,UE functional use,ROM,IADL,Fascial restriction,Scar mobility,Wound,Coordination,FMC,Skin integrity,Decreased knowledge of precautions,Flexibility       Visit Diagnosis: Other lack of coordination  Other symptoms and signs involving the musculoskeletal system  Pain in left hand  Stiffness of left hand, not elsewhere classified    Problem List There are no problems to display for this patient.   Ailene Ravel, OTR/L,CBIS  [phone]  07/19/2020, 6:19 PM  [facility] [address] [place], [place], [address] Phone: [phone]   Fax:  [phone]  Name: Melissa Meyers MRN: [id number] Date of Birth: [birth year]

## 2020-07-23 ENCOUNTER — Other Ambulatory Visit: Payer: Self-pay

## 2020-07-23 ENCOUNTER — Encounter (HOSPITAL_COMMUNITY): Payer: Self-pay | Admitting: Physical Therapy

## 2020-07-23 ENCOUNTER — Ambulatory Visit (HOSPITAL_COMMUNITY): Payer: No Typology Code available for payment source | Admitting: Physical Therapy

## 2020-07-23 ENCOUNTER — Ambulatory Visit (HOSPITAL_COMMUNITY): Payer: No Typology Code available for payment source | Admitting: Occupational Therapy

## 2020-07-23 DIAGNOSIS — M25562 Pain in left knee: Secondary | ICD-10-CM

## 2020-07-23 DIAGNOSIS — R262 Difficulty in walking, not elsewhere classified: Secondary | ICD-10-CM

## 2020-07-23 DIAGNOSIS — R278 Other lack of coordination: Secondary | ICD-10-CM | POA: Diagnosis not present

## 2020-07-23 DIAGNOSIS — G8929 Other chronic pain: Secondary | ICD-10-CM

## 2020-07-23 NOTE — Therapy (Signed)
[facility] [address] [place], [place], [address] Phone: [phone]   Fax:  [phone]  Physical Therapy Treatment and Progress note  Patient Details  Name: Melissa Meyers MRN: [id number] Date of Birth: 25-Apr-1966 Referring Provider (PT): Marsh Dolly  Progress Note Reporting Period 06/13/20  to 07/23/20  See note below for Objective Data and Assessment of Progress/Goals.       Encounter Date: 07/23/2020   PT End of Session - 07/23/20 1530    Visit Number 10    Number of Visits 15    Date for PT Re-Evaluation 08/01/20    Authorization Type VA, 15 visits approved    Authorization - Visit Number 10    Authorization - Number of Visits 15    Progress Note Due on Visit 10    PT Start Time 8832    PT Stop Time 1610    PT Time Calculation (min) 40 min    Activity Tolerance Patient tolerated treatment well    Behavior During Therapy WFL for tasks assessed/performed           Past Medical History:  Diagnosis Date  . Acid reflux   . Sinus drainage   . Thyroid disease     Past Surgical History:  Procedure Laterality Date  . TUBAL LIGATION      There were no vitals filed for this visit.   Subjective Assessment - 07/23/20 1530    Subjective Patient reports that she feels between 95-97% better. States she still has pain but it is more tolerable range. States that she still has difficulty standing for long periods of time (15 minutes) and doesn't think she can run anymore.    How long can you stand comfortably? 15 min    How long can you walk comfortably? 15-20 min    Patient Stated Goals Be able to ride a bike again    Currently in Pain? Yes    Pain Score 2     Pain Location Knee    Pain Orientation Left    Pain Descriptors / Indicators Aching    Pain Type Acute pain    Pain Onset More than a month ago              [facility] PT Assessment - 07/23/20 0001      Assessment   Medical Diagnosis left knee pain    Referring  Provider (PT) Marsh Dolly      AROM   Left Knee Extension 0   was 0   Left Knee Flexion 112   was 100     Strength   Left Knee Flexion 4/5   was 3+, slight pain today   Left Knee Extension 4+/5   was 3, and had extensor lag with SLR initially; no extensor lag noted with 5 SLR today     Transfers   Transfers Sit to Stand;Stand to Sit    Sit to Stand 7: Independent   equal weightbearing on both legs   Stand to Sit 6: Modified independent (Device/Increase time)   favors right leg, slight discomfort with lower surfaces.     Ambulation/Gait   Ambulation/Gait Yes    Ambulation/Gait Assistance 7: Independent    Ambulation Distance (Feet) 422 Feet    Assistive device None    Gait Pattern Decreased stance time - left   tibial whip noted bilaterally   Ambulation Surface Level;Indoor    Gait velocity WNL    Gait Comments 2MW  [facility] Adult PT Treatment/Exercise - 07/23/20 0001      Knee/Hip Exercises: Seated   Sit to Sand 2 sets;15 reps;without UE support   visual cues for form                 PT Education - 07/23/20 1556    Education Details on healthy eating habits, on HEP and progress made while in therapy. on POC moving forward, on action plan for healthy eating. on sleep hygeine and quality and ways to improve sleep    Person(s) Educated Patient    Methods Explanation    Comprehension Verbalized understanding            PT Short Term Goals - 07/23/20 1536      PT SHORT TERM GOAL #1   Title Patient will be independent and consistent with preliminary HEP for LE strength    Time 3    Period Weeks    Status Achieved    Target Date 07/04/20      PT SHORT TERM GOAL #2   Title Patient will report at least 25% improvement in symptoms for improved quality of life.    Baseline 97% better    Time 3    Period Weeks    Status Achieved    Target Date 07/04/20      PT SHORT TERM GOAL #3   Title Patient will be able to ambulate at  least 260 feet in 2MWT in order to demonstrate improved gait speed for community ambulation.    Baseline 422    Time 3    Period Weeks    Status Achieved    Target Date 07/04/20             PT Long Term Goals - 07/23/20 1538      PT LONG TERM GOAL #1   Title Patient will demonstrate left knee flexion to 115 degrees to improve symmetry with sit-stand    Time 7    Period Weeks    Status On-going      PT LONG TERM GOAL #2   Title Patient will report left knee pain not exceeding 2/10 with functional squat    Baseline 3/10 to 75 degrees hip flexion    Time 7    Period Weeks    Status On-going      PT LONG TERM GOAL #3   Title Patient will be able to bicycle x 15 minutes without pain to facilitate return to activity    Baseline without increase in pain (baseline pain stays the same)    Time 7    Period Weeks    Status Partially Met                 Plan - 07/23/20 1530    Clinical Impression Statement Patient present for a progress note on this date. Overall patient is progressing towards goal with all short term goals met and making progress towards long term goals. Session focused on educating patient on sleep hygiene practices and ways to reducing hunger at night where she is woken up due to hunger pains. Discussed current POC and patient would continue to benefit from skilled physical therapy to improve functional ROM and strength to improve overall quality of life.    Personal Factors and Comorbidities Comorbidity 1;Time since onset of injury/illness/exacerbation    Comorbidities left hand injury    Examination-Activity Limitations Bend;Carry;Stairs;Squat;Locomotion Level;Stand    Examination-Participation Restrictions Community Activity;Other   exercise, bike riding  Stability/Clinical Decision Making Stable/Uncomplicated    Rehab Potential Good    PT Frequency 2x / week    PT Duration --   7 weeks   PT Treatment/Interventions ADLs/Self Care Home  Management;Aquatic Therapy;Cryotherapy;Electrical Stimulation;Ultrasound;Traction;Moist Heat;DME Instruction;Gait training;Stair training;Functional mobility training;Therapeutic activities;Therapeutic exercise;Patient/family education;Neuromuscular re-education;Balance training;Manual techniques;Taping;Energy conservation;Dry needling;Passive range of motion;Spinal Manipulations;Joint Manipulations    PT Next Visit Plan Continue to progress knee stabilization and balance/ propriception. Add balance beam next visit. Increase to green resistance band when able    PT Home Exercise Plan QS, SLR, mini-squats from elevated surface. OKC PRE with red t-loop, heel raise, standing hip abduction 1/19 band hip abduction/ extension, band sidestepping; 1/24 heel slides, LAQs 2/2 prone quad stretch    Consulted and Agree with Plan of Care Patient           Patient will benefit from skilled therapeutic intervention in order to improve the following deficits and impairments:  Abnormal gait,Decreased activity tolerance,Decreased endurance,Decreased range of motion,Difficulty walking,Decreased strength,Impaired flexibility,Pain  Visit Diagnosis: Chronic pain of left knee  Difficulty in walking, not elsewhere classified     Problem List There are no problems to display for this patient.   4:14 PM, 07/23/20 Jerene Pitch, DPT Physical Therapy with [facility]  [phone] office  [place] [address] [place], [place], [address] Phone: [phone]   Fax:  [phone]  Name: Melissa Meyers MRN: [id number] Date of Birth: [birth year]

## 2020-07-25 ENCOUNTER — Ambulatory Visit (HOSPITAL_COMMUNITY): Payer: No Typology Code available for payment source | Admitting: Physical Therapy

## 2020-07-25 ENCOUNTER — Encounter (HOSPITAL_COMMUNITY): Payer: Self-pay | Admitting: Physical Therapy

## 2020-07-25 ENCOUNTER — Ambulatory Visit (HOSPITAL_COMMUNITY): Payer: No Typology Code available for payment source

## 2020-07-25 ENCOUNTER — Other Ambulatory Visit: Payer: Self-pay

## 2020-07-25 DIAGNOSIS — R278 Other lack of coordination: Secondary | ICD-10-CM | POA: Diagnosis not present

## 2020-07-25 DIAGNOSIS — G8929 Other chronic pain: Secondary | ICD-10-CM

## 2020-07-25 DIAGNOSIS — R262 Difficulty in walking, not elsewhere classified: Secondary | ICD-10-CM

## 2020-07-26 NOTE — Progress Notes (Signed)
   07/25/20 0001  Knee/Hip Exercises: Stretches  Gastroc Stretch Both;3 reps;30 seconds  Gastroc Stretch Limitations slant board  Knee/Hip Exercises: Aerobic  Recumbent Bike 4 min dynamic warmup  Knee/Hip Exercises: Machines for Strengthening  Cybex Leg Press 5 Pl 2 x 10  Other Machine machine deadlift 30# 2 x 19, straight leg deadlift 2 x10 30#  Knee/Hip Exercises: Standing  Heel Raises Both;20 reps  Heel Raises Limitations from incline  SLS with Vectors 3 x 10" hold on foam  Other Standing Knee Exercises band sidestepping GTB 3 RT, monster walks GTB x 3RT   

## 2020-07-26 NOTE — Progress Notes (Signed)
   07/25/20 0001  Knee/Hip Exercises: Stretches  Gastroc Stretch Both;3 reps;30 seconds  Gastroc Stretch Limitations slant board  Knee/Hip Exercises: Aerobic  Recumbent Bike 4 min dynamic warmup  Knee/Hip Exercises: Machines for Strengthening  Cybex Leg Press 5 Pl 2 x 10  Other Machine machine deadlift 30# 2 x 19, straight leg deadlift 2 x10 30#  Knee/Hip Exercises: Standing  Heel Raises Both;20 reps  Heel Raises Limitations from incline  SLS with Vectors 3 x 10" hold on foam  Other Standing Knee Exercises band sidestepping GTB 3 RT, monster walks GTB x 3RT

## 2020-07-26 NOTE — Therapy (Signed)
[name], [place], [address] Phone: [phone]   Fax:  [phone]  Physical Therapy Treatment  Patient Details  Name: Melissa Meyers MRN: [id number] Date of Birth: 01-14-1966 Referring Provider (PT): Marsh Dolly   Encounter Date: 07/25/2020   PT End of Session - 07/25/20 1533    Visit Number 11    Number of Visits 15    Date for PT Re-Evaluation 08/01/20    Authorization Type VA, 15 visits approved    Authorization - Visit Number 11    Authorization - Number of Visits 15    Progress Note Due on Visit 10    PT Start Time 1520    PT Stop Time 1600    PT Time Calculation (min) 40 min    Activity Tolerance Patient tolerated treatment well    Behavior During Therapy [facility] for tasks assessed/performed           Past Medical History:  Diagnosis Date  . Acid reflux   . Sinus drainage   . Thyroid disease     Past Surgical History:  Procedure Laterality Date  . TUBAL LIGATION      There were no vitals filed for this visit.   Subjective Assessment - 07/25/20 1532    Subjective Patient says she is doing much better. Still having some stiffness. Exercise is helping. Wants to be able to ride her bike again.    How long can you stand comfortably? 15 min    How long can you walk comfortably? 15-20 min    Patient Stated Goals Be able to ride a bike again    Currently in Pain? Yes    Pain Score 2     Pain Location Knee    Pain Orientation Left    Pain Descriptors / Indicators Aching    Pain Type Acute pain    Pain Onset More than a month ago    Pain Frequency Constant               07/25/20 0001  Knee/Hip Exercises: Stretches  Gastroc Stretch Both;3 reps;30 seconds  Gastroc Stretch Limitations slant board  Knee/Hip Exercises: Aerobic  Recumbent Bike 4 min dynamic warmup  Knee/Hip Exercises: Machines for Strengthening  Cybex Leg Press 5 Pl 2 x 10  Other Machine machine deadlift 30# 2 x 19, straight  leg deadlift 2 x10 30#  Knee/Hip Exercises: Standing  Heel Raises Both;20 reps  Heel Raises Limitations from incline  SLS with Vectors 3 x 10" hold on foam  Other Standing Knee Exercises band sidestepping GTB 3 RT, monster walks GTB x 3RT      PT Short Term Goals - 07/23/20 1536      PT SHORT TERM GOAL #1   Title Patient will be independent and consistent with preliminary HEP for LE strength    Time 3    Period Weeks    Status Achieved    Target Date 07/04/20      PT SHORT TERM GOAL #2   Title Patient will report at least 25% improvement in symptoms for improved quality of life.    Baseline 97% better    Time 3    Period Weeks    Status Achieved    Target Date 07/04/20      PT SHORT TERM GOAL #3   Title Patient will be able to ambulate at least 260 feet in 2MWT in order to demonstrate improved gait speed for community  ambulation.    Baseline 422    Time 3    Period Weeks    Status Achieved    Target Date 07/04/20             PT Long Term Goals - 07/23/20 1538      PT LONG TERM GOAL #1   Title Patient will demonstrate left knee flexion to 115 degrees to improve symmetry with sit-stand    Time 7    Period Weeks    Status On-going      PT LONG TERM GOAL #2   Title Patient will report left knee pain not exceeding 2/10 with functional squat    Baseline 3/10 to 75 degrees hip flexion    Time 7    Period Weeks    Status On-going      PT LONG TERM GOAL #3   Title Patient will be able to bicycle x 15 minutes without pain to facilitate return to activity    Baseline without increase in pain (baseline pain stays the same)    Time 7    Period Weeks    Status Partially Met                 Plan - 07/26/20 1338    Clinical Impression Statement Patient tolerated today's session well with no increased complaint of pain. Patient able to perform recumbent bike with minimal discomfort, which previously had caused knee pain. Increased band resistance with  sidestepping and monster walks to green band. Patient noting increased muscle activity. Added machine deadlifting and straight leg deadlifting for increased hamstring activation and strengthening. Patient required verbal cues and demo for proper form but was able to show good return. Patient will benefit from continued knee strength and stabilization progressions for reduced pain and improved functional mobility.    Personal Factors and Comorbidities Comorbidity 1;Time since onset of injury/illness/exacerbation    Comorbidities left hand injury    Examination-Activity Limitations Bend;Carry;Stairs;Squat;Locomotion Level;Stand    Examination-Participation Restrictions Community Activity;Other   exercise, bike riding   Stability/Clinical Decision Making Stable/Uncomplicated    Rehab Potential Good    PT Frequency 2x / week    PT Duration --   7 weeks   PT Treatment/Interventions ADLs/Self Care Home Management;Aquatic Therapy;Cryotherapy;Electrical Stimulation;Ultrasound;Traction;Moist Heat;DME Instruction;Gait training;Stair training;Functional mobility training;Therapeutic activities;Therapeutic exercise;Patient/family education;Neuromuscular re-education;Balance training;Manual techniques;Taping;Energy conservation;Dry needling;Passive range of motion;Spinal Manipulations;Joint Manipulations    PT Next Visit Plan Continue to progress knee stabilization and balance/ propriception. Add balance beam next visit.    PT Home Exercise Plan QS, SLR, mini-squats from elevated surface. OKC PRE with red t-loop, heel raise, standing hip abduction 1/19 band hip abduction/ extension, band sidestepping; 1/24 heel slides, LAQs 2/2 prone quad stretch    Consulted and Agree with Plan of Care Patient           Patient will benefit from skilled therapeutic intervention in order to improve the following deficits and impairments:  Abnormal gait,Decreased activity tolerance,Decreased endurance,Decreased range of  motion,Difficulty walking,Decreased strength,Impaired flexibility,Pain  Visit Diagnosis: Chronic pain of left knee  Difficulty in walking, not elsewhere classified     Problem List There are no problems to display for this patient.  1:46 PM, 07/26/20 Josue Hector PT DPT  Physical Therapist with [facility]  [phone] [place] [address] [place], [place], [address] Phone: [phone]   Fax:  [phone]  Name: YEZENIA FREDRICK MRN: [id number] Date  of Birth: 28-Jan-1966

## 2020-07-30 ENCOUNTER — Ambulatory Visit (HOSPITAL_COMMUNITY): Payer: No Typology Code available for payment source | Admitting: Physical Therapy

## 2020-07-30 ENCOUNTER — Other Ambulatory Visit: Payer: Self-pay

## 2020-07-30 ENCOUNTER — Encounter (HOSPITAL_COMMUNITY): Payer: Self-pay | Admitting: Physical Therapy

## 2020-07-30 ENCOUNTER — Ambulatory Visit (HOSPITAL_COMMUNITY): Payer: No Typology Code available for payment source

## 2020-07-30 ENCOUNTER — Telehealth (HOSPITAL_COMMUNITY): Payer: Self-pay

## 2020-07-30 DIAGNOSIS — G8929 Other chronic pain: Secondary | ICD-10-CM

## 2020-07-30 DIAGNOSIS — R262 Difficulty in walking, not elsewhere classified: Secondary | ICD-10-CM

## 2020-07-30 DIAGNOSIS — R278 Other lack of coordination: Secondary | ICD-10-CM | POA: Diagnosis not present

## 2020-07-30 DIAGNOSIS — M25562 Pain in left knee: Secondary | ICD-10-CM

## 2020-07-30 NOTE — Telephone Encounter (Signed)
pt cancelled this appt due to auth not back yet.

## 2020-07-30 NOTE — Therapy (Signed)
Melissa Meyers, [place], [address] Phone: [phone]   Fax:  [phone]  Physical Therapy Treatment and RECERT  Patient Details  Name: Melissa Meyers MRN: [id number] Date of Birth: 05-19-1966 Referring Provider (PT): Marsh Dolly   Encounter Date: 07/30/2020   PT End of Session - 07/30/20 1536    Visit Number 12    Number of Visits 15    Date for PT Re-Evaluation 08/13/20    Authorization Type VA, 15 visits approved    Authorization - Visit Number 12    Authorization - Number of Visits 15    Progress Note Due on Visit 10    PT Start Time 1532    PT Stop Time 1612    PT Time Calculation (min) 40 min    Activity Tolerance Patient tolerated treatment well    Behavior During Therapy [facility] for tasks assessed/performed           Past Medical History:  Diagnosis Date  . Acid reflux   . Sinus drainage   . Thyroid disease     Past Surgical History:  Procedure Laterality Date  . TUBAL LIGATION      There were no vitals filed for this visit.   Subjective Assessment - 07/30/20 1534    Subjective States that she has about 1.5/10 pain in her knee. States she felt pretty good after last session.    How long can you stand comfortably? 15 min    How long can you walk comfortably? 15-20 min    Patient Stated Goals Be able to ride a bike again    Currently in Pain? Yes    Pain Score 2     Pain Location Knee    Pain Orientation Left    Pain Descriptors / Indicators Aching    Pain Onset More than a month ago           Objective information carried from 07/23/20   [facility] PT Assessment - 07/30/20 0001      Assessment   Medical Diagnosis left knee pain    Referring Provider (PT) Marsh Dolly      AROM   Left Knee Extension 0   was 0   Left Knee Flexion 112   was 100     Strength   Left Knee Flexion 4/5   was 3+, slight pain today   Left Knee Extension 4+/5   was 3, and had extensor lag with SLR initially; no  extensor lag noted with 5 SLR today     Transfers   Transfers Sit to Stand;Stand to Sit    Sit to Stand 7: Independent   equal weightbearing on both legs   Stand to Sit 6: Modified independent (Device/Increase time)   favors right leg, slight discomfort with lower surfaces.     Ambulation/Gait   Ambulation/Gait Yes    Ambulation/Gait Assistance 7: Independent    Ambulation Distance (Feet) 422 Feet    Assistive device None    Gait Pattern Decreased stance time - left   tibial whip noted bilaterally   Gait velocity WNL    Gait Comments 2MW                         [facility] Adult PT Treatment/Exercise - 07/30/20 0001      Knee/Hip Exercises: Stretches   Gastroc Stretch Both;3 reps;30 seconds    Gastroc Stretch Limitations slant board  Knee/Hip Exercises: Standing   Other Standing Knee Exercises chair pose 2x5 10" holds; balance beam - tandem xx6 laps, lateral stepping 2x4 bilateral; forward cone taps (hip hinge with visual target Left/Right/center) 3x10 to 3 targets - cues to keep knees bend nad to come all the way back up    Other Standing Knee Exercises SLS 2x5 30" holds L      Knee/Hip Exercises: Seated   Sit to Sand 5 reps;2 sets   hover above the chair 5" holds                 PT Education - 07/30/20 1610    Education Details on progress made, on HEP and current presentation    Person(s) Educated Patient    Methods Explanation    Comprehension Verbalized understanding            PT Short Term Goals - 07/23/20 1536      PT SHORT TERM GOAL #1   Title Patient will be independent and consistent with preliminary HEP for LE strength    Time 3    Period Weeks    Status Achieved    Target Date 07/04/20      PT SHORT TERM GOAL #2   Title Patient will report at least 25% improvement in symptoms for improved quality of life.    Baseline 97% better    Time 3    Period Weeks    Status Achieved    Target Date 07/04/20      PT SHORT TERM GOAL #3    Title Patient will be able to ambulate at least 260 feet in 2MWT in order to demonstrate improved gait speed for community ambulation.    Baseline 422    Time 3    Period Weeks    Status Achieved    Target Date 07/04/20             PT Long Term Goals - 07/23/20 1538      PT LONG TERM GOAL #1   Title Patient will demonstrate left knee flexion to 115 degrees to improve symmetry with sit-stand    Time 7    Period Weeks    Status On-going      PT LONG TERM GOAL #2   Title Patient will report left knee pain not exceeding 2/10 with functional squat    Baseline 3/10 to 75 degrees hip flexion    Time 7    Period Weeks    Status On-going      PT LONG TERM GOAL #3   Title Patient will be able to bicycle x 15 minutes without pain to facilitate return to activity    Baseline without increase in pain (baseline pain stays the same)    Time 7    Period Weeks    Status Partially Met                 Plan - 07/30/20 1536    Clinical Impression Statement Added balance beam and hip hinge (cone tap exercise) and this was tolerated well but patient fatigued quickly. No increase in pain noted. Verbal cues with hip hinge exercise to keep knees soft and return to upright position. Extending certification dates secondary to patient missing a couple visits earlier in certification and she would still benefit from skilled physical therapy to improve overall function and quality of life.    Personal Factors and Comorbidities Comorbidity 1;Time since onset of injury/illness/exacerbation    Comorbidities left hand  injury    Examination-Activity Limitations Bend;Carry;Stairs;Squat;Locomotion Level;Stand    Examination-Participation Restrictions Community Activity;Other   exercise, bike riding   Stability/Clinical Decision Making Stable/Uncomplicated    Rehab Potential Good    PT Frequency Other (comment)   3 visits over the next 2 weeks   PT Duration 2 weeks   7 weeks   PT  Treatment/Interventions ADLs/Self Care Home Management;Aquatic Therapy;Cryotherapy;Electrical Stimulation;Ultrasound;Traction;Moist Heat;DME Instruction;Gait training;Stair training;Functional mobility training;Therapeutic activities;Therapeutic exercise;Patient/family education;Neuromuscular re-education;Balance training;Manual techniques;Taping;Energy conservation;Dry needling;Passive range of motion;Spinal Manipulations;Joint Manipulations    PT Next Visit Plan Continue to progress knee stabilization and balance/ propriception.    PT Home Exercise Plan QS, SLR, mini-squats from elevated surface. OKC PRE with red t-loop, heel raise, standing hip abduction 1/19 band hip abduction/ extension, band sidestepping; 1/24 heel slides, LAQs 2/2 prone quad stretch    Consulted and Agree with Plan of Care Patient           Patient will benefit from skilled therapeutic intervention in order to improve the following deficits and impairments:  Abnormal gait,Decreased activity tolerance,Decreased endurance,Decreased range of motion,Difficulty walking,Decreased strength,Impaired flexibility,Pain  Visit Diagnosis: Chronic pain of left knee - Plan: PT plan of care cert/re-cert  Difficulty in walking, not elsewhere classified - Plan: PT plan of care cert/re-cert     Problem List There are no problems to display for this patient.  Macario Golds  [place] [address] [place], [place], [address] Phone: [phone]   Fax:  [phone]  Name: LIYANA SUNIGA MRN: [id number] Date of Birth: 1965/07/30

## 2020-08-01 ENCOUNTER — Ambulatory Visit (HOSPITAL_COMMUNITY): Payer: No Typology Code available for payment source

## 2020-08-01 ENCOUNTER — Encounter (HOSPITAL_COMMUNITY): Payer: Self-pay | Admitting: Physical Therapy

## 2020-08-01 ENCOUNTER — Ambulatory Visit (HOSPITAL_COMMUNITY): Payer: No Typology Code available for payment source | Admitting: Physical Therapy

## 2020-08-01 ENCOUNTER — Other Ambulatory Visit: Payer: Self-pay

## 2020-08-01 ENCOUNTER — Telehealth (HOSPITAL_COMMUNITY): Payer: Self-pay

## 2020-08-01 DIAGNOSIS — M25562 Pain in left knee: Secondary | ICD-10-CM

## 2020-08-01 DIAGNOSIS — G8929 Other chronic pain: Secondary | ICD-10-CM

## 2020-08-01 DIAGNOSIS — R278 Other lack of coordination: Secondary | ICD-10-CM | POA: Diagnosis not present

## 2020-08-01 DIAGNOSIS — R262 Difficulty in walking, not elsewhere classified: Secondary | ICD-10-CM

## 2020-08-01 NOTE — Telephone Encounter (Signed)
LE said to cx all future apptment for OT until we get VA auth. t. I hope they will respond to my 5th phone call and let us know something soon. VA confirms they have the fax request and are waiting on the MD to sign the request.

## 2020-08-01 NOTE — Therapy (Signed)
[place], [place], [address] Phone: [phone]   Fax:  [phone]  Physical Therapy Treatment  Patient Details  Name: Melissa Meyers MRN: [id number] Date of Birth: 1965-06-13 Referring Provider (PT): Marsh Dolly   Encounter Date: 08/01/2020   PT End of Session - 08/01/20 1515    Visit Number 13    Number of Visits 15    Date for PT Re-Evaluation 08/13/20    Authorization Type VA, 15 visits approved    Authorization - Visit Number 13    Authorization - Number of Visits 15    Progress Note Due on Visit 10    PT Start Time 1510    PT Stop Time 1555    PT Time Calculation (min) 45 min    Activity Tolerance Patient tolerated treatment well    Behavior During Therapy [facility] for tasks assessed/performed           Past Medical History:  Diagnosis Date  . Acid reflux   . Sinus drainage   . Thyroid disease     Past Surgical History:  Procedure Laterality Date  . TUBAL LIGATION      There were no vitals filed for this visit.   Subjective Assessment - 08/01/20 1513    Subjective Doing well overall, knee hurts a little from going up and down stairs today. More difficult coming down them.    How long can you stand comfortably? 15 min    How long can you walk comfortably? 15-20 min    Patient Stated Goals Be able to ride a bike again    Currently in Pain? Yes    Pain Score 2     Pain Location Knee    Pain Orientation Left    Pain Descriptors / Indicators Aching    Pain Type Acute pain    Pain Onset More than a month ago    Pain Frequency Constant                             [facility] Adult PT Treatment/Exercise - 08/01/20 0001      Knee/Hip Exercises: Stretches   Gastroc Stretch Both;3 reps;30 seconds    Gastroc Stretch Limitations slant board      Knee/Hip Exercises: Aerobic   Recumbent Bike 4 min dynamic warmup lv 3      Knee/Hip Exercises: Machines for Strengthening   Cybex Knee  Flexion 4.5 PL 2 x 10    Cybex Leg Press 6 Pl 2 x 10    Other Machine machine TKE 40# 2 x 10      Knee/Hip Exercises: Standing   Forward Step Up Left;2 sets;10 reps;Hand Hold: 0    Forward Step Up Limitations on BOSU    Other Standing Knee Exercises band sidestepping GTB 3 RT, monster walks GTB x 3RT    Other Standing Knee Exercises single leg step tap 2 x10                    PT Short Term Goals - 07/23/20 1536      PT SHORT TERM GOAL #1   Title Patient will be independent and consistent with preliminary HEP for LE strength    Time 3    Period Weeks    Status Achieved    Target Date 07/04/20      PT SHORT TERM GOAL #2   Title Patient will report  at least 25% improvement in symptoms for improved quality of life.    Baseline 97% better    Time 3    Period Weeks    Status Achieved    Target Date 07/04/20      PT SHORT TERM GOAL #3   Title Patient will be able to ambulate at least 260 feet in 2MWT in order to demonstrate improved gait speed for community ambulation.    Baseline 422    Time 3    Period Weeks    Status Achieved    Target Date 07/04/20             PT Long Term Goals - 07/23/20 1538      PT LONG TERM GOAL #1   Title Patient will demonstrate left knee flexion to 115 degrees to improve symmetry with sit-stand    Time 7    Period Weeks    Status On-going      PT LONG TERM GOAL #2   Title Patient will report left knee pain not exceeding 2/10 with functional squat    Baseline 3/10 to 75 degrees hip flexion    Time 7    Period Weeks    Status On-going      PT LONG TERM GOAL #3   Title Patient will be able to bicycle x 15 minutes without pain to facilitate return to activity    Baseline without increase in pain (baseline pain stays the same)    Time 7    Period Weeks    Status Partially Met                 Plan - 08/01/20 1805    Clinical Impression Statement Patient progressing well and reports no increased complaint of pain  during today's session. Increased resistance with leg pressing and hamstring curls. Added single leg step taps for LE stabilization/ proprioception. Patient did well with this after cueing for hip position and opposite LE extension. Patient notes increased muscle fatigue with ther ex progressions today. Patient will continue to benefit from continued therapy services to progress strength and stabilization to reduce knee pain and improve functional mobility.    Personal Factors and Comorbidities Comorbidity 1;Time since onset of injury/illness/exacerbation    Comorbidities left hand injury    Examination-Activity Limitations Bend;Carry;Stairs;Squat;Locomotion Level;Stand    Examination-Participation Restrictions Community Activity;Other   exercise, bike riding   Stability/Clinical Decision Making Stable/Uncomplicated    Rehab Potential Good    PT Frequency Other (comment)   3 visits over the next 2 weeks   PT Duration 2 weeks   7 weeks   PT Treatment/Interventions ADLs/Self Care Home Management;Aquatic Therapy;Cryotherapy;Electrical Stimulation;Ultrasound;Traction;Moist Heat;DME Instruction;Gait training;Stair training;Functional mobility training;Therapeutic activities;Therapeutic exercise;Patient/family education;Neuromuscular re-education;Balance training;Manual techniques;Taping;Energy conservation;Dry needling;Passive range of motion;Spinal Manipulations;Joint Manipulations    PT Next Visit Plan Continue to progress knee stabilization and balance/ propriception. Add balance beam sidestepping, tandem gait    PT Home Exercise Plan QS, SLR, mini-squats from elevated surface. OKC PRE with red t-loop, heel raise, standing hip abduction 1/19 band hip abduction/ extension, band sidestepping; 1/24 heel slides, LAQs 2/2 prone quad stretch    Consulted and Agree with Plan of Care Patient           Patient will benefit from skilled therapeutic intervention in order to improve the following deficits and  impairments:  Abnormal gait,Decreased activity tolerance,Decreased endurance,Decreased range of motion,Difficulty walking,Decreased strength,Impaired flexibility,Pain  Visit Diagnosis: Chronic pain of left knee  Difficulty in walking, not elsewhere  classified     Problem List There are no problems to display for this patient.  6:10 PM, 08/01/20 Josue Hector PT DPT  Physical Therapist with [facility]  [phone] [place] [address] [place], [place], [address] Phone: [phone]   Fax:  [phone]  Name: Melissa Meyers MRN: [id number] Date of Birth: 05/05/1966

## 2020-08-02 ENCOUNTER — Telehealth (HOSPITAL_COMMUNITY): Payer: Self-pay

## 2020-08-02 NOTE — Telephone Encounter (Signed)
L//m for pt to call us back for information concerning VA auth for OT. Patient will need to see her [facility] provider before they will approve more OT visits.

## 2020-08-02 NOTE — Telephone Encounter (Signed)
S/w Sabrina this morning from [place] - They will not approve anymore OT visit for her until she sees her Care Provider at the [facility] for re-evaluation. Martie Lee stated that the veterans know this and it's their responsibility to follow up with their Care Provider at the [facility]. Martie Lee also wants [place] to submit a request for more PT visits if the veteran need to continue care. Patient has had 13 PT visits and only 15 were approved.

## 2020-08-06 ENCOUNTER — Other Ambulatory Visit: Payer: Self-pay

## 2020-08-06 ENCOUNTER — Encounter (HOSPITAL_COMMUNITY): Payer: Self-pay | Admitting: Physical Therapy

## 2020-08-06 ENCOUNTER — Ambulatory Visit (HOSPITAL_COMMUNITY): Payer: No Typology Code available for payment source | Admitting: Physical Therapy

## 2020-08-06 DIAGNOSIS — R262 Difficulty in walking, not elsewhere classified: Secondary | ICD-10-CM

## 2020-08-06 DIAGNOSIS — G8929 Other chronic pain: Secondary | ICD-10-CM

## 2020-08-06 DIAGNOSIS — R278 Other lack of coordination: Secondary | ICD-10-CM | POA: Diagnosis not present

## 2020-08-06 DIAGNOSIS — M25562 Pain in left knee: Secondary | ICD-10-CM

## 2020-08-06 NOTE — Therapy (Signed)
[place], [place], [address] Phone: [phone]   Fax:  [phone]  Physical Therapy Treatment  Patient Details  Name: Melissa Meyers MRN: [id number] Date of Birth: 1966/03/15 Referring Provider (PT): Marsh Dolly   Encounter Date: 08/06/2020   PT End of Session - 08/06/20 1354    Visit Number 14    Number of Visits 15    Date for PT Re-Evaluation 08/13/20    Authorization Type VA, 15 visits approved    Authorization - Visit Number 14    Authorization - Number of Visits 15    Progress Note Due on Visit 10    PT Start Time 8891    PT Stop Time 1438    PT Time Calculation (min) 40 min    Activity Tolerance Patient tolerated treatment well    Behavior During Therapy [facility] for tasks assessed/performed           Past Medical History:  Diagnosis Date  . Acid reflux   . Sinus drainage   . Thyroid disease     Past Surgical History:  Procedure Laterality Date  . TUBAL LIGATION      There were no vitals filed for this visit.   Subjective Assessment - 08/06/20 1401    Subjective States her pain is 1.5/10 in her knee. States she is doing the exercises without difficulties.    How long can you stand comfortably? 15 min    How long can you walk comfortably? 15-20 min    Patient Stated Goals Be able to ride a bike again    Currently in Pain? Yes    Pain Score 2     Pain Location Knee    Pain Orientation Left    Pain Descriptors / Indicators Aching    Pain Onset More than a month ago              [facility] PT Assessment - 08/06/20 0001      Assessment   Medical Diagnosis left knee pain    Referring Provider (PT) Marsh Dolly                         [facility] Adult PT Treatment/Exercise - 08/06/20 0001      Knee/Hip Exercises: Aerobic   Recumbent Bike 4 min dynamic warmup lv 4      Knee/Hip Exercises: Standing   Functional Squat 3 sets;10 reps   overhead arms for form   Other Standing Knee  Exercises tandem isometric on blue foam x2 30" Bilateral , then with head rotations x5 Bilateral, then with ball transfers 2x5 Bilateral; deadlift - form x20 with verbal and tactile cues then with box 2x12    Other Standing Knee Exercises crossover stepups 6" step 4x6 B                  PT Education - 08/06/20 1417    Education Details Educated patient on different exercises to perform at gym and using stepper vs elliptical for cardio.    Person(s) Educated Patient    Methods Explanation    Comprehension Verbalized understanding            PT Short Term Goals - 07/23/20 1536      PT SHORT TERM GOAL #1   Title Patient will be independent and consistent with preliminary HEP for LE strength    Time 3    Period Weeks  Status Achieved    Target Date 07/04/20      PT SHORT TERM GOAL #2   Title Patient will report at least 25% improvement in symptoms for improved quality of life.    Baseline 97% better    Time 3    Period Weeks    Status Achieved    Target Date 07/04/20      PT SHORT TERM GOAL #3   Title Patient will be able to ambulate at least 260 feet in 2MWT in order to demonstrate improved gait speed for community ambulation.    Baseline 422    Time 3    Period Weeks    Status Achieved    Target Date 07/04/20             PT Long Term Goals - 07/23/20 1538      PT LONG TERM GOAL #1   Title Patient will demonstrate left knee flexion to 115 degrees to improve symmetry with sit-stand    Time 7    Period Weeks    Status On-going      PT LONG TERM GOAL #2   Title Patient will report left knee pain not exceeding 2/10 with functional squat    Baseline 3/10 to 75 degrees hip flexion    Time 7    Period Weeks    Status On-going      PT LONG TERM GOAL #3   Title Patient will be able to bicycle x 15 minutes without pain to facilitate return to activity    Baseline without increase in pain (baseline pain stays the same)    Time 7    Period Weeks    Status  Partially Met                 Plan - 08/06/20 1355    Clinical Impression Statement Continued progression of lower body strengthening. Fatigue noted with new exercises. Added crossover step ups and deadlifts to home program. Patient required verbal and tactile cues with deadlifts. Anticipate patient to discharge next session, instructed patient to go through exercises prior to next session.    Personal Factors and Comorbidities Comorbidity 1;Time since onset of injury/illness/exacerbation    Comorbidities left hand injury    Examination-Activity Limitations Bend;Carry;Stairs;Squat;Locomotion Level;Stand    Examination-Participation Restrictions Community Activity;Other   exercise, bike riding   Stability/Clinical Decision Making Stable/Uncomplicated    Rehab Potential Good    PT Frequency Other (comment)   3 visits over the next 2 weeks   PT Duration 2 weeks   7 weeks   PT Treatment/Interventions ADLs/Self Care Home Management;Aquatic Therapy;Cryotherapy;Electrical Stimulation;Ultrasound;Traction;Moist Heat;DME Instruction;Gait training;Stair training;Functional mobility training;Therapeutic activities;Therapeutic exercise;Patient/family education;Neuromuscular re-education;Balance training;Manual techniques;Taping;Energy conservation;Dry needling;Passive range of motion;Spinal Manipulations;Joint Manipulations    PT Next Visit Plan Continue to progress knee stabilization and balance/ propriception. Add balance beam sidestepping, tandem gait    PT Home Exercise Plan QS, SLR, mini-squats from elevated surface. OKC PRE with red t-loop, heel raise, standing hip abduction 1/19 band hip abduction/ extension, band sidestepping; 1/24 heel slides, LAQs 2/2 prone quad stretch; 2/28 deadlift, crossover stepup    Consulted and Agree with Plan of Care Patient           Patient will benefit from skilled therapeutic intervention in order to improve the following deficits and impairments:  Abnormal  gait,Decreased activity tolerance,Decreased endurance,Decreased range of motion,Difficulty walking,Decreased strength,Impaired flexibility,Pain  Visit Diagnosis: Chronic pain of left knee  Difficulty in walking, not elsewhere classified  Problem List There are no problems to display for this patient.  2:46 PM, 08/06/20 Jerene Pitch, DPT Physical Therapy with [facility]  [phone] office  [place] [address] [place], [place], [address] Phone: [phone]   Fax:  [phone]  Name: Melissa Meyers MRN: [id number] Date of Birth: 1965-06-15

## 2020-08-08 ENCOUNTER — Other Ambulatory Visit: Payer: Self-pay

## 2020-08-08 ENCOUNTER — Encounter (HOSPITAL_COMMUNITY): Payer: Self-pay | Admitting: Physical Therapy

## 2020-08-08 ENCOUNTER — Ambulatory Visit (HOSPITAL_COMMUNITY): Payer: No Typology Code available for payment source | Attending: Internal Medicine | Admitting: Physical Therapy

## 2020-08-08 DIAGNOSIS — M2560 Stiffness of unspecified joint, not elsewhere classified: Secondary | ICD-10-CM | POA: Diagnosis present

## 2020-08-08 DIAGNOSIS — M25562 Pain in left knee: Secondary | ICD-10-CM | POA: Diagnosis present

## 2020-08-08 DIAGNOSIS — R262 Difficulty in walking, not elsewhere classified: Secondary | ICD-10-CM | POA: Diagnosis present

## 2020-08-08 DIAGNOSIS — G8929 Other chronic pain: Secondary | ICD-10-CM | POA: Diagnosis present

## 2020-08-08 NOTE — Therapy (Signed)
[place] [address] [place], [place], [address] Phone: [phone]   Fax:  [phone]  Physical Therapy Treatment and Discharge Note  Patient Details  Name: Melissa Meyers MRN: [id number] Date of Birth: [birth year] Referring Provider (PT): Marsh Dolly   PHYSICAL THERAPY DISCHARGE SUMMARY  Visits from Start of Care: 15  Current functional level related to goals / functional outcomes: See below   Remaining deficits: continued pain and ROM deficits    Education / Equipment: See below  Plan: Patient agrees to discharge.  Patient goals were partially met. Patient is being discharged due to being pleased with the current functional level.  ?????              Encounter Date: 08/08/2020   PT End of Session - 08/08/20 1524    Visit Number 15    Number of Visits 15    Date for PT Re-Evaluation 08/13/20    Authorization Type VA, 15 visits approved    Authorization - Visit Number 15    Authorization - Number of Visits 15    Progress Note Due on Visit 10    PT Start Time 1525    PT Stop Time 1552    PT Time Calculation (min) 27 min    Activity Tolerance Patient tolerated treatment well    Behavior During Therapy WFL for tasks assessed/performed           Past Medical History:  Diagnosis Date  . Acid reflux   . Sinus drainage   . Thyroid disease     Past Surgical History:  Procedure Laterality Date  . TUBAL LIGATION      There were no vitals filed for this visit.   Subjective Assessment - 08/08/20 1528    Subjective STates her exercises are going well. States she came across her ballet book and started doing some of those exerciess. Reports some pain but it's better. States that overall she feels about 95% better.    How long can you stand comfortably? 15 min    How long can you walk comfortably? 15-20 min    Patient Stated Goals Be able to ride a bike again    Pain Onset More than a month ago               [facility] PT Assessment - 08/08/20 0001      Assessment   Medical Diagnosis left knee pain    Referring Provider (PT) Marsh Dolly      Observation/Other Assessments   Focus on Therapeutic Outcomes (FOTO)  N/A      AROM   Left Knee Extension 0    Left Knee Flexion 116                         [facility] Adult PT Treatment/Exercise - 08/08/20 0001      Knee/Hip Exercises: Aerobic   Stationary Bike 15 minutes level 1 - no pain      Knee/Hip Exercises: Standing   Functional Squat 5 reps   no pain with squat to chair height                 PT Education - 08/08/20 1532    Education Details on progress made, on goals met, reveiwed HEP and answered all questions. reviewed HEP while patient on bike    Person(s) Educated Patient    Methods Explanation    Comprehension Verbalized understanding  PT Short Term Goals - 07/23/20 1536      PT SHORT TERM GOAL #1   Title Patient will be independent and consistent with preliminary HEP for LE strength    Time 3    Period Weeks    Status Achieved    Target Date 07/04/20      PT SHORT TERM GOAL #2   Title Patient will report at least 25% improvement in symptoms for improved quality of life.    Baseline 97% better    Time 3    Period Weeks    Status Achieved    Target Date 07/04/20      PT SHORT TERM GOAL #3   Title Patient will be able to ambulate at least 260 feet in 2MWT in order to demonstrate improved gait speed for community ambulation.    Baseline 422    Time 3    Period Weeks    Status Achieved    Target Date 07/04/20             PT Long Term Goals - 08/08/20 1530      PT LONG TERM GOAL #1   Title Patient will demonstrate left knee flexion to 115 degrees to improve symmetry with sit-stand    Time 7    Period Weeks    Status Achieved      PT LONG TERM GOAL #2   Title Patient will report left knee pain not exceeding 2/10 with functional squat    Time 7    Period Weeks     Status Achieved      PT LONG TERM GOAL #3   Title Patient will be able to bicycle x 15 minutes without pain to facilitate return to activity    Baseline --    Time 7    Period Weeks    Status Achieved                 Plan - 08/08/20 1525    Clinical Impression Statement All short term goals met at this time and all long term goals met. Reviewed HEP and answered all question during today's session. Patient overall is making great progress and independent in HEP at this time. Patient to discharge from PT to HEP secondary to progress made.    Personal Factors and Comorbidities Comorbidity 1;Time since onset of injury/illness/exacerbation    Comorbidities left hand injury    Examination-Activity Limitations Bend;Carry;Stairs;Squat;Locomotion Level;Stand    Examination-Participation Restrictions Community Activity;Other   exercise, bike riding   Stability/Clinical Decision Making Stable/Uncomplicated    Rehab Potential Good    PT Frequency Other (comment)   3 visits over the next 2 weeks   PT Duration 2 weeks   7 weeks   PT Treatment/Interventions ADLs/Self Care Home Management;Aquatic Therapy;Cryotherapy;Electrical Stimulation;Ultrasound;Traction;Moist Heat;DME Instruction;Gait training;Stair training;Functional mobility training;Therapeutic activities;Therapeutic exercise;Patient/family education;Neuromuscular re-education;Balance training;Manual techniques;Taping;Energy conservation;Dry needling;Passive range of motion;Spinal Manipulations;Joint Manipulations    PT Next Visit Plan pt to discharge from PT to HEP    PT Home Exercise Plan QS, SLR, mini-squats from elevated surface. OKC PRE with red t-loop, heel raise, standing hip abduction 1/19 band hip abduction/ extension, band sidestepping; 1/24 heel slides, LAQs 2/2 prone quad stretch; 2/28 deadlift, crossover stepup    Consulted and Agree with Plan of Care Patient           Patient will benefit from skilled therapeutic  intervention in order to improve the following deficits and impairments:  Abnormal gait,Decreased activity tolerance,Decreased endurance,Decreased range of  motion,Difficulty walking,Decreased strength,Impaired flexibility,Pain  Visit Diagnosis: Chronic pain of left knee  Difficulty in walking, not elsewhere classified  Reduced active range of joint movement     Problem List There are no problems to display for this patient.  3:54 PM, 08/08/20 Jerene Pitch, DPT Physical Therapy with [facility]  [phone] office  [place] [address] [place], [place], [address] Phone: [phone]   Fax:  [phone]  Name: Melissa Meyers MRN: [id number] Date of Birth: [birth year]

## 2020-09-24 ENCOUNTER — Encounter (HOSPITAL_COMMUNITY): Payer: Self-pay

## 2020-09-24 NOTE — Therapy (Signed)
[place], [place], [address] Phone: [phone]   Fax:  [phone]  Patient Details  Name: Melissa Meyers MRN: [id number] Date of Birth: [birth year] Referring Provider:  No ref. provider found  Encounter Date: 09/24/2020  OCCUPATIONAL THERAPY DISCHARGE SUMMARY  Visits from Start of Care: 16 Patient has not been seen at this OP clinic since 07/18/20. OT was placed on hold as 15/15 VA authorized visits were used. When requesting additional visits, VA states that patient will need to see her [facility] provider before they will approve more OT visits. [facility] desk attempted to contact patient and left message with this information. Pt has not returned communication to let us know if she has seen her [facility] Provider. According to Care Everywhere in patient's medical chart, it appears that she may be receiving OT via the [place] although she has never informed us. Patient will be discharged from OT services this date.   Current functional level related to goals / functional outcomes:  OT LONG TERM GOAL #1   Title Patient will return to prior level of function using her LUE as dominant with daily tasks.    Time 12    Period Weeks    Status On-going        OT LONG TERM GOAL #2   Title Patient will improve LUE A/ROM to WNL for improved ability to drive, and complete desired daily tasks at 75% or more of the time.    Time 12    Period Weeks    Status On-going        OT LONG TERM GOAL #3   Title Patient will improve LUE grip and pinch strength to [facility] in order to open up jars and containers.    Time 12    Period Weeks    Status On-going        OT LONG TERM GOAL #4   Title Patient will increase her fine motor coordination of her left hand while completing the nine hole peg test in 30" or less in the standardized fashion in order to return to utlizing her left hand for all desired coordination tasks.    Time 12     Period Weeks       Remaining deficits: Decreased ROM of left index finger. Decreased grip and pinch strength   Education / Equipment: HEP for hand and finger ROM, hand strength. Plan: Patient agrees to discharge.  Patient goals were not met. Patient is being discharged due to not returning since the last visit.  ?????              Ailene Ravel, OTR/L,CBIS  [phone]   09/24/2020, 3:56 PM  [place] [address] [place], [place], [address] Phone: [phone]   Fax:  [phone]

## 2020-10-04 ENCOUNTER — Ambulatory Visit (HOSPITAL_COMMUNITY): Payer: No Typology Code available for payment source | Attending: Internal Medicine

## 2020-10-04 ENCOUNTER — Other Ambulatory Visit: Payer: Self-pay

## 2020-10-04 ENCOUNTER — Encounter (HOSPITAL_COMMUNITY): Payer: Self-pay

## 2020-10-04 DIAGNOSIS — M25642 Stiffness of left hand, not elsewhere classified: Secondary | ICD-10-CM | POA: Diagnosis present

## 2020-10-04 DIAGNOSIS — R278 Other lack of coordination: Secondary | ICD-10-CM | POA: Insufficient documentation

## 2020-10-04 DIAGNOSIS — R29898 Other symptoms and signs involving the musculoskeletal system: Secondary | ICD-10-CM | POA: Insufficient documentation

## 2020-10-04 DIAGNOSIS — M79642 Pain in left hand: Secondary | ICD-10-CM | POA: Insufficient documentation

## 2020-10-05 ENCOUNTER — Telehealth (HOSPITAL_COMMUNITY): Payer: Self-pay

## 2020-10-05 NOTE — Therapy (Signed)
[place] [facility] [address] [place], [place], [address] Phone: [phone]   Fax:  [phone]  Occupational Therapy Re-Evaluation  Patient Details  Name: Melissa Meyers MRN: [id number] Date of Birth: 02/28/1966 Referring Provider (OT): Gertha Calkin, MD   Encounter Date: 10/04/2020   OT End of Session - 10/05/20 0911    Visit Number 1    Number of Visits 1    Authorization Type VA has authorized 15 visits    Authorization - Visit Number 1    Authorization - Number of Visits 15    OT Start Time 1030    OT Stop Time 1115    OT Time Calculation (min) 45 min    Activity Tolerance Patient tolerated treatment well    Behavior During Therapy [facility] for tasks assessed/performed           Past Medical History:  Diagnosis Date  . Acid reflux   . Sinus drainage   . Thyroid disease     Past Surgical History:  Procedure Laterality Date  . TUBAL LIGATION      There were no vitals filed for this visit.   Subjective Assessment - 10/05/20 0849    Subjective  S: The surgeon that operated is no longer there. He was a resident on rotation and there is a new one assigned to me now.    Pertinent History Melissa Meyers is a returning patient S/P left index flexor tendon repair (FDS, FDP, Zone II) which was completed on 04/26/20. She attended OT services at this clinic 05/22/20-07/18/20. Services were put on hold after using 15 visits approved by the [place]. Patient was told no more would be approved until she had a follow up with her surgeon. During previous OT plan of care, patient made progress with hand strength and ROM of all digits except the left index finger. A build up of scar tissue remained and inhibited patient's ability to progress her ROM and strength in this finger. Pt reports that her hand surgeon had told her if she makes no progress with gaining mobility and strength he would recommend going back in to clean out scar tissue. Patient as since followed up with  her primary physician Gertha Calkin, MD) and her new ortho surgeon on rotation although unable to recall his name. He only recommended returning to OT services at this clinic.    Patient Stated Goals To regain use of her hand.    Currently in Pain? No/denies   No pain at rest. When attempting to use her left hand and grip this will get painful and sore.            [facility] OT Assessment - 10/04/20 1044      Assessment   Medical Diagnosis left index finger tendon repair    Referring Provider (OT) Gertha Calkin, MD    Onset Date/Surgical Date 04/26/20    Hand Dominance Right    Next MD Visit 12/14/20    Prior Therapy Yes. Pt has received OT services at this clinic for post op care (05/22/20-07/18/20).      Precautions   Precautions None      Restrictions   Weight Bearing Restrictions No      Balance Screen   Has the patient fallen in the past 6 months No      Home  Environment   Family/patient expects to be discharged to: Private residence      Prior Function   Level of Independence Independent  Vocation Unemployed    Leisure cycling, gym      ADL   ADL comments Patient has increased difficulty with maintaining a tight grasp on items in her left hand. Difficulty forming a full fist. Unable to demonstrate active movement in the DIP joint of the left index finger.      Mobility   Mobility Status Independent      Written Expression   Dominant Hand Right      Vision - History   Baseline Vision No visual deficits      Cognition   Overall Cognitive Status Within Functional Limits for tasks assessed      Observation/Other Assessments   Focus on Therapeutic Outcomes (FOTO)  N/A      ROM / Strength   AROM / PROM / Strength Strength;AROM;PROM      AROM   Left Wrist Extension 68 Degrees   previous: 58   Left Wrist Flexion 80 Degrees   previous: 74     Strength   Strength Assessment Site Hand    Right/Left hand Left;Right    Right Hand Grip (lbs) 70    Right Hand Lateral  Pinch 20 lbs    Right Hand 3 Point Pinch 16 lbs    Left Hand Grip (lbs) 45   previous reassessment (07/11/20): 25   Left Hand Lateral Pinch 16 lbs   previous: 11   Left Hand 3 Point Pinch 10 lbs   previous: 7                                                                  Left Hand AROM   L Thumb MCP 0-60 66 Degrees    L Thumb IP 0-80 80 Degrees   previous: 77   L Index  MCP 0-90 88 Degrees   previous: 76   L Index PIP 0-100 64 Degrees   previous: 52   L Index DIP 0-70 0 Degrees   previous: 0   L Long  MCP 0-90 82 Degrees   previous: 78   L Long PIP 0-100 90 Degrees   previous: 86   L Long DIP 0-70 78 Degrees   previous: 62   L Ring  MCP 0-90 82 Degrees   previous: 74   L Ring PIP 0-100 88 Degrees   previuos: 90   L Ring DIP 0-70 74 Degrees   previous: 68   L Little  MCP 0-90 82 Degrees   previous: 80   L Little PIP 0-100 82 Degrees   previous: WNL   L Little DIP 0-70 80 Degrees   previous: WNL                          OT Education - 10/05/20 0910    Education Details Reviewed evaluation findings and discussed recommendations to return to orthopedic surgeon to discuss option of removing scar tissue.    Person(s) Educated Patient    Methods Explanation    Comprehension Verbalized understanding                 Plan - 10/05/20 0912    Clinical Impression Statement A: Patien tis a 55 y/o female S/P left Flexor Tendon injury in left IF in  October 2021. Pt attending OP OT services at this clinic 05/22/20-07/18/20 using 15 visits approved by [place]. Pt had progressed in all areas of her hand with the exception of achieving functional active and passive movement of her left index finger with DIP joint worse than PIP joint. Today, ROM was re-evaluated. Pt has made improvement with all joints of her left hand that were lacking functional movement with the exception of her left DIP joint which remained at 0 degrees flexion. She is unable to achieve a full fist  with this index finger. Grip and pinch strength continue to be limited. During her previous plan of care, it was discussed that the scar tissue that had developed was hindering her ability to progress her ROM and strength. Today, this remains true. Discussed findings of evaluation. At this time, patient is not a candidate to continue OT services due to scar tissue that remains present in left hand which is hindering patient's ability to progress her passive and actice ROM. Recommended returning to Orthopedic hand surgeon and discuss option of removal of scar tissue and if it would benefit patient. Dr. Levester Fresh, MD is no longer the surgical resident at the [facility] and has since moved on to another rotation. Pt has been provided a paraffin wax unit from the [place] and requires assistance to set up correctly for home use. A screen appointment has been created to address this with OT.    OT Occupational Profile and History Detailed Assessment- Review of Records and additional review of physical, cognitive, psychosocial history related to current functional performance    Occupational performance deficits (Please refer to evaluation for details): ADL's;IADL's;Leisure    Body Structure / Function / Physical Skills ADL;UE functional use;Pain;FMC;Coordination;ROM;Strength;Mobility;Scar mobility    Rehab Potential --   N/A   Clinical Decision Making Limited treatment options, no task modification necessary    Comorbidities Affecting Occupational Performance: May have comorbidities impacting occupational performance    Modification or Assistance to Complete Evaluation  No modification of tasks or assist necessary to complete eval    OT Frequency One time visit    OT Treatment/Interventions Patient/family education    Plan P: one time visit for OT evaluation. Recommend that patient return to orthopedic surgeon to discuss option of removing scar tissue in order to increase mobility of hand. Pt will return to clinic  for a screen visit with her paraffin wax unit for set-up instructions.    OT Home Exercise Plan Continue with established HEP.    Consulted and Agree with Plan of Care Patient           Patient will benefit from skilled therapeutic intervention in order to improve the following deficits and impairments:   Body Structure / Function / Physical Skills: ADL,UE functional use,Pain,FMC,Coordination,ROM,Strength,Mobility,Scar mobility       Visit Diagnosis: Other lack of coordination - Plan: Ot plan of care cert/re-cert  Other symptoms and signs involving the musculoskeletal system - Plan: Ot plan of care cert/re-cert  Pain in left hand - Plan: Ot plan of care cert/re-cert  Stiffness of left hand, not elsewhere classified - Plan: Ot plan of care cert/re-cert    Problem List There are no problems to display for this patient.  Limmie Patricia, OTR/L,CBIS  [phone]  10/05/2020, 9:30 AM  [place] [facility] [address] [place], [place], [address] Phone: [phone]   Fax:  [phone]  Name: Melissa Meyers MRN: [id number] Date of Birth: Apr 22, 1966

## 2020-10-05 NOTE — Telephone Encounter (Signed)
Faxed progress note 10/04/20 to [facility] (Attn: Sabrina) @[phone].

## 2020-10-18 ENCOUNTER — Ambulatory Visit (HOSPITAL_COMMUNITY): Payer: No Typology Code available for payment source | Attending: Internal Medicine

## 2020-10-18 ENCOUNTER — Other Ambulatory Visit: Payer: Self-pay

## 2020-10-18 DIAGNOSIS — M79642 Pain in left hand: Secondary | ICD-10-CM | POA: Insufficient documentation

## 2020-10-18 NOTE — Therapy (Signed)
[facility] [address] [place], [place], [address] Phone: [phone]   Fax:  [phone]  Patient Details  Name: AMAYIAH GOSNELL MRN: [id number] Date of Birth: [birth year] Referring Provider:  Kari Baars, MD  Encounter Date: 10/18/2020  OT screen for assist with Paraffin wax dip unit set-up. Answered questions regarding proper use and set-up of unit. All education completed.   Limmie Patricia, OTR/L,CBIS  [phone] 10/18/2020, 4:22 PM  [place] [facility] [address] [place], [place], [address] Phone: [phone]   Fax:  [phone]

## 2023-01-06 ENCOUNTER — Encounter: Payer: Self-pay | Admitting: Emergency Medicine

## 2023-01-06 ENCOUNTER — Ambulatory Visit
Admission: EM | Admit: 2023-01-06 | Discharge: 2023-01-06 | Disposition: A | Discharge status: Home / Self Care | Payer: No Typology Code available for payment source

## 2023-01-06 ENCOUNTER — Other Ambulatory Visit: Payer: Self-pay

## 2023-01-06 ENCOUNTER — Encounter (HOSPITAL_COMMUNITY): Payer: Self-pay | Admitting: Pharmacy Technician

## 2023-01-06 ENCOUNTER — Emergency Department (HOSPITAL_COMMUNITY)
Admission: EM | Admit: 2023-01-06 | Discharge: 2023-01-06 | Disposition: A | Discharge status: Home / Self Care | Payer: No Typology Code available for payment source | Attending: Emergency Medicine | Admitting: Emergency Medicine

## 2023-01-06 DIAGNOSIS — L03113 Cellulitis of right upper limb: Secondary | ICD-10-CM | POA: Diagnosis not present

## 2023-01-06 DIAGNOSIS — I959 Hypotension, unspecified: Secondary | ICD-10-CM

## 2023-01-06 DIAGNOSIS — M7989 Other specified soft tissue disorders: Secondary | ICD-10-CM | POA: Diagnosis not present

## 2023-01-06 DIAGNOSIS — R2231 Localized swelling, mass and lump, right upper limb: Secondary | ICD-10-CM | POA: Diagnosis present

## 2023-01-06 DIAGNOSIS — S60561A Insect bite (nonvenomous) of right hand, initial encounter: Secondary | ICD-10-CM | POA: Diagnosis not present

## 2023-01-06 DIAGNOSIS — D72829 Elevated white blood cell count, unspecified: Secondary | ICD-10-CM | POA: Diagnosis not present

## 2023-01-06 DIAGNOSIS — W57XXXA Bitten or stung by nonvenomous insect and other nonvenomous arthropods, initial encounter: Secondary | ICD-10-CM

## 2023-01-06 HISTORY — DX: Essential (primary) hypertension: I10

## 2023-01-06 LAB — CBC WITH DIFFERENTIAL/PLATELET
Abs Immature Granulocytes: 0.21 10*3/uL — ABNORMAL HIGH (ref 0.00–0.07)
Basophils Absolute: 0.1 10*3/uL (ref 0.0–0.1)
Basophils Relative: 0 %
Eosinophils Absolute: 0 10*3/uL (ref 0.0–0.5)
Eosinophils Relative: 0 %
HCT: 37.4 % (ref 36.0–46.0)
Hemoglobin: 12.5 g/dL (ref 12.0–15.0)
Immature Granulocytes: 1 %
Lymphocytes Relative: 5 %
Lymphs Abs: 0.8 10*3/uL (ref 0.7–4.0)
MCH: 30.6 pg (ref 26.0–34.0)
MCHC: 33.4 g/dL (ref 30.0–36.0)
MCV: 91.4 fL (ref 80.0–100.0)
Monocytes Absolute: 1.6 10*3/uL — ABNORMAL HIGH (ref 0.1–1.0)
Monocytes Relative: 10 %
Neutro Abs: 12.5 10*3/uL — ABNORMAL HIGH (ref 1.7–7.7)
Neutrophils Relative %: 84 %
Platelets: 193 10*3/uL (ref 150–400)
RBC: 4.09 MIL/uL (ref 3.87–5.11)
RDW: 13.2 % (ref 11.5–15.5)
WBC: 15.1 10*3/uL — ABNORMAL HIGH (ref 4.0–10.5)
nRBC: 0 % (ref 0.0–0.2)

## 2023-01-06 LAB — BASIC METABOLIC PANEL
Anion gap: 7 (ref 5–15)
BUN: 15 mg/dL (ref 6–20)
CO2: 27 mmol/L (ref 22–32)
Calcium: 8.3 mg/dL — ABNORMAL LOW (ref 8.9–10.3)
Chloride: 102 mmol/L (ref 98–111)
Creatinine, Ser: 1.29 mg/dL — ABNORMAL HIGH (ref 0.44–1.00)
GFR, Estimated: 49 mL/min — ABNORMAL LOW (ref >60)
Glucose, Bld: 113 mg/dL — ABNORMAL HIGH (ref 70–99)
Potassium: 3.4 mmol/L — ABNORMAL LOW (ref 3.5–5.1)
Sodium: 136 mmol/L (ref 135–145)

## 2023-01-06 LAB — LACTIC ACID, PLASMA: Lactic Acid, Venous: 1.5 mmol/L (ref 0.5–1.9)

## 2023-01-06 MED ORDER — KETOROLAC TROMETHAMINE 30 MG/ML IJ SOLN
15.0000 mg | Freq: Once | INTRAMUSCULAR | Status: AC
  Administered 2023-01-06: 15 mg via INTRAVENOUS
  Filled 2023-01-06: qty 1

## 2023-01-06 MED ORDER — HYDROCODONE-ACETAMINOPHEN 5-325 MG PO TABS
1.0000 | ORAL_TABLET | Freq: Four times a day (QID) | ORAL | 0 refills | Status: DC | PRN
Start: 2023-01-06 — End: 2023-01-06

## 2023-01-06 MED ORDER — SODIUM CHLORIDE 0.9 % IV SOLN
1.0000 g | Freq: Once | INTRAVENOUS | Status: AC
  Administered 2023-01-06: 1 g via INTRAVENOUS
  Filled 2023-01-06: qty 10

## 2023-01-06 MED ORDER — SODIUM CHLORIDE 0.9 % IV BOLUS
500.0000 mL | Freq: Once | INTRAVENOUS | Status: AC
  Administered 2023-01-06: 500 mL via INTRAVENOUS

## 2023-01-06 MED ORDER — HYDROCODONE-ACETAMINOPHEN 5-325 MG PO TABS: 1.0000 | ORAL_TABLET | Freq: Four times a day (QID) | ORAL | 0 refills | Status: AC | PRN

## 2023-01-06 NOTE — ED Notes (Signed)
Patient is being discharged from the Urgent Care and sent to the Emergency Department via private vehicle. Per NP, patient is in need of higher level of care due to right hand swelling from insect bite and low BP. Patient is aware and verbalizes understanding of plan of care.  Vitals:   01/06/23 0813 01/06/23 0824  BP: (!) 87/49 (!) 104/57  Pulse:    Resp:    Temp:    SpO2:

## 2023-01-06 NOTE — Discharge Instructions (Signed)
Please continue taking your cephalexin, as directed.  Return here for concerning changes in your condition.

## 2023-01-06 NOTE — ED Triage Notes (Signed)
Pt here with reports of insect bite to right middle finger with swelling worsening. Pt states was told yesterday by PCP that if the center of the bite became black to follow up. Pt noticed yesterday evening that the center was starting to turn black. Went to UC today and sent to the ED due to systolic BP being in the 90's.

## 2023-01-06 NOTE — ED Provider Notes (Signed)
[place] EMERGENCY DEPARTMENT AT [facility] Provider Note   CSN: [id number] Arrival date & time: 01/06/23  0845     History  Chief Complaint  Patient presents with   Insect Bite    Melissa Meyers is a 57 y.o. female.  HPI Patient presents with family member assists with history.  She arrives same-day is being seen in urgent care and sent here for evaluation. She has concern for 4 days of pain around the third digit right hand.  She does not recall exact onset, but 4 days ago developed pain, swelling, with a focal amount of erythema on the dorsum of the proximal third digit.  She saw her physician at the [facility], was presumptively diagnosed with spider bite.  Today she went to urgent care as she developed a focal area of discoloration and return precautions included looking out for this development.  Thus far she has taken 3 doses of Keflex.  No fever, no vomiting, she does have mild nausea.  She presents with pain as above as well as swelling from the dorsum of the hand to the forearm right side.    Home Medications Prior to Admission medications   Medication Sig Start Date End Date Taking? Authorizing Provider  HYDROcodone-acetaminophen (NORCO/VICODIN) 5-325 MG tablet Take 1 tablet by mouth every 6 (six) hours as needed for severe pain. 01/06/23  Yes Gerhard Munch, MD  albuterol (VENTOLIN HFA) 108 (90 Base) MCG/ACT inhaler Inhale into the lungs every 6 (six) hours as needed for wheezing or shortness of breath.    [provider]  cephALEXin (KEFLEX) 500 MG capsule Take 500 mg by mouth 3 (three) times daily.    [provider]  fluticasone (FLONASE) 50 MCG/ACT nasal spray Place 2 sprays into both nostrils at bedtime.  Patient not taking: Reported on 10/05/2020    [provider]  fluticasone-salmeterol (ADVAIR) 100-50 MCG/ACT AEPB Inhale 1 puff into the lungs 2 (two) times daily.    [provider]  gabapentin (NEURONTIN) 400 MG capsule Take  400 mg by mouth 3 (three) times daily.    [provider]  ibuprofen (ADVIL,MOTRIN) 800 MG tablet Take 800 mg by mouth every 8 (eight) hours as needed.    [provider]  levothyroxine (SYNTHROID, LEVOTHROID) 50 MCG tablet Take 50 mcg by mouth daily before breakfast.    [provider]  Menthol-Methyl Salicylate (THERA-GESIC) 0.5-15 % CREA Apply topically.    [provider]  omeprazole (PRILOSEC) 20 MG capsule Take 40 mg by mouth 2 (two) times daily.    [provider]  traMADol (ULTRAM) 50 MG tablet Take by mouth every 6 (six) hours as needed.    [provider]      Allergies    Penicillins    Review of Systems   Review of Systems  All other systems reviewed and are negative.   Physical Exam Updated Vital Signs BP 118/68   Pulse 85   Temp 99.4 F (37.4 C)   Resp 16   SpO2 96%  Physical Exam Vitals and nursing note reviewed.  Constitutional:      General: She is not in acute distress.    Appearance: Normal appearance.  HENT:     Head: Normocephalic.  Eyes:     Conjunctiva/sclera: Conjunctivae normal.  Cardiovascular:     Rate and Rhythm: Normal rate and regular rhythm.     Pulses: Normal pulses.  Pulmonary:     Effort: Pulmonary effort is normal.  Abdominal:     Palpations: Abdomen is soft.  Musculoskeletal:     Right upper arm: Swelling present.     Comments: Warmth and swelling noted to the right upper extremity from th elbow extending to the right hand. Right middle finger with marked area of erythema with darkened central punctum.   Skin:    General: Skin is warm and dry.  Neurological:     General: No focal deficit present.     Mental Status: She is alert.     Comments: Patient is slow to respond to questions asked.  Unable to determine if this is due to her blood pressure or current symptoms, or if this is her baseline.  Psychiatric:        Mood and Affect: Mood normal.        Behavior: Behavior normal.      ED Results / Procedures / Treatments   Labs (all labs ordered are listed, but only abnormal results are displayed) Labs Reviewed  BASIC METABOLIC PANEL - Abnormal; Notable for the following components:      Result Value   Potassium 3.4 (*)    Glucose, Bld 113 (*)    Creatinine, Ser 1.29 (*)    Calcium 8.3 (*)    GFR, Estimated 49 (*)    All other components within normal limits  CBC WITH DIFFERENTIAL/PLATELET - Abnormal; Notable for the following components:   WBC 15.1 (*)    Neutro Abs 12.5 (*)    Monocytes Absolute 1.6 (*)    Abs Immature Granulocytes 0.21 (*)    All other components within normal limits  LACTIC ACID, PLASMA  LACTIC ACID, PLASMA    EKG None  Radiology No results found.  Procedures Procedures    Medications Ordered in ED Medications  ketorolac (TORADOL) 30 MG/ML injection 15 mg (15 mg Intravenous Given 01/06/23 1100)  sodium chloride 0.9 % bolus 500 mL (500 mLs Intravenous New Bag/Given 01/06/23 1055)  cefTRIAXone (ROCEPHIN) 1 g in sodium chloride 0.9 % 100 mL IVPB (1 g Intravenous New Bag/Given 01/06/23 1055)    ED Course/ Medical Decision Making/ A&P                                 Medical Decision Making Patient presents with focal lesion on the dorsum of the right third digit.  Concern for infection versus inflammation, some suspicion for inflammation secondary to vector based infection.  Distal preserved neurovascular status absence of fever are reassuring.  Labs sent, IV ceftriaxone ordered.  Amount and/or Complexity of Data Reviewed Independent Historian: friend External Data Reviewed: notes.    Details: Nurse practitioner note from today reviewed Labs: ordered. Decision-making details documented in ED Course.  Risk Prescription drug management.   12:13 PM Patient awake, alert, afebrile.  Labs reviewed, discussed, leukocytosis, but no lactic acidosis.  Patient received IV fluids, ceftriaxone.  We discussed admission given  consideration of cellulitis with SIRS versus outpatient follow-up and the patient notes that she has a strong preference for the latter.  She explains understanding of return precautions, follow-up instructions.  Patient will continue her antibiotics, was discharged in stable condition.        Final Clinical Impression(s) / ED Diagnoses Final diagnoses:  Cellulitis of right upper extremity    Rx / DC Orders ED Discharge Orders          Ordered    HYDROcodone-acetaminophen (NORCO/VICODIN) 5-325 MG tablet  Every 6 hours PRN        01/06/23 1213              Gerhard Munch, MD 01/06/23 1213

## 2023-01-06 NOTE — ED Triage Notes (Signed)
Insect bite to right middle finger since Friday.  Hand swollen.  Was placed on Keflex and Ibuprofen yesterday.  States area has gotten worse.

## 2023-01-06 NOTE — Discharge Instructions (Signed)
Go to the emergency department for further evaluation

## 2023-01-06 NOTE — ED Provider Notes (Signed)
[facility]    CSN: [id number] Arrival date & time: 01/06/23  0803      History   Chief Complaint No chief complaint on file.   HPI Melissa Meyers is a 57 y.o. female.   The history is provided by the patient.   The patient presents for complaints of right extremity swelling that is extends from the elbow into the right hand.  Patient states that she was bitten by an insect approximately 4 days ago.  Patient states that she was seen by her doctor at the [place] 1 day ago and the doctor deduced that she may have been bitten by a brown recluse.  Patient states she did not see what bit her on her right middle finger.  States patient's doctor "googled" the type of bite that she ha has, and deduced that this may be a brown recluse bite.  Patient states she was started on Keflex.  She has taken 3 doses since she started the medication.  Patient states that she has not noticed increased swelling in the right arm, but states that it has not improved since she was seen 1 day ago.  Patient presents to this urgent care hypotensive.  Patient has a history of hypertension.  She has not taken her blood pressure medication today.  Patient denies fever, chest pain, shortness of breath, difficulty breathing, abdominal pain, nausea, vomiting, or diarrhea.   Past Medical History:  Diagnosis Date   Acid reflux    Hypertension    Sinus drainage    Thyroid disease     There are no problems to display for this patient.   Past Surgical History:  Procedure Laterality Date   HAND SURGERY Left    TUBAL LIGATION      OB History   No obstetric history on file.      Home Medications    Prior to Admission medications   Medication Sig Start Date End Date Taking? Authorizing Provider  albuterol (VENTOLIN HFA) 108 (90 Base) MCG/ACT inhaler Inhale into the lungs every 6 (six) hours as needed for wheezing or shortness of breath.   Yes [provider]  cephALEXin (KEFLEX) 500 MG capsule Take  500 mg by mouth 3 (three) times daily.   Yes [provider]  fluticasone (FLONASE) 50 MCG/ACT nasal spray Place 2 sprays into both nostrils at bedtime.  Patient not taking: Reported on 10/05/2020    [provider]  fluticasone-salmeterol (ADVAIR) 100-50 MCG/ACT AEPB Inhale 1 puff into the lungs 2 (two) times daily.    [provider]  gabapentin (NEURONTIN) 400 MG capsule Take 400 mg by mouth 3 (three) times daily.    [provider]  ibuprofen (ADVIL,MOTRIN) 800 MG tablet Take 800 mg by mouth every 8 (eight) hours as needed.    [provider]  levothyroxine (SYNTHROID, LEVOTHROID) 50 MCG tablet Take 50 mcg by mouth daily before breakfast.    [provider]  Menthol-Methyl Salicylate (THERA-GESIC) 0.5-15 % CREA Apply topically.    [provider]  omeprazole (PRILOSEC) 20 MG capsule Take 40 mg by mouth 2 (two) times daily.    [provider]  traMADol (ULTRAM) 50 MG tablet Take by mouth every 6 (six) hours as needed.    [provider]    Family History History reviewed. No pertinent family history.  Social History Social History   Tobacco Use   Smoking status: Former    Current packs/day: 1.00    Types: Cigarettes  Smokeless tobacco: Never  Vaping Use   Vaping status: Never Used  Substance Use Topics   Alcohol use: No   Drug use: No     Allergies   Penicillins   Review of Systems Review of Systems Per HPI  Physical Exam Triage Vital Signs ED Triage Vitals  Encounter Vitals Group     BP 01/06/23 0813 (!) 87/49     Systolic BP Percentile --      Diastolic BP Percentile --      Pulse Rate 01/06/23 0808 90     Resp 01/06/23 0808 18     Temp 01/06/23 0808 99.9 F (37.7 C)     Temp Source 01/06/23 0808 Oral     SpO2 01/06/23 0808 94 %     Weight --      Height --      Head Circumference --      Peak Flow --      Pain Score 01/06/23 0812 9     Pain Loc --      Pain Education --       Exclude from Growth Chart --    No data found.  Updated Vital Signs BP (!) 104/57 (BP Location: Left Arm)   Pulse 90   Temp 99.9 F (37.7 C) (Oral)   Resp 18   SpO2 94%   Visual Acuity Right Eye Distance:   Left Eye Distance:   Bilateral Distance:    Right Eye Near:   Left Eye Near:    Bilateral Near:     Physical Exam Vitals and nursing note reviewed.  Constitutional:      General: She is not in acute distress.    Appearance: Normal appearance.  HENT:     Head: Normocephalic.  Eyes:     Extraocular Movements: Extraocular movements intact.     Pupils: Pupils are equal, round, and reactive to light.  Cardiovascular:     Rate and Rhythm: Normal rate and regular rhythm.     Pulses: Normal pulses.     Heart sounds: Normal heart sounds.  Pulmonary:     Effort: Pulmonary effort is normal. No respiratory distress.     Breath sounds: Normal breath sounds. No stridor. No wheezing, rhonchi or rales.  Abdominal:     General: Bowel sounds are normal.     Palpations: Abdomen is soft.     Tenderness: There is no abdominal tenderness.  Musculoskeletal:     Right upper arm: Swelling present.     Cervical back: Normal range of motion.     Comments: Warmth and swelling noted to the right upper extremity from th elbow extending to the right hand. Right middle finger with marked area of erythema with darkened central punctum.   Lymphadenopathy:     Cervical: No cervical adenopathy.  Skin:    General: Skin is warm and dry.  Neurological:     Mental Status: She is alert.     Comments: Patient is slow to respond to questions asked.  Unable to determine if this is due to her blood pressure or current symptoms, or if this is her baseline.  Psychiatric:        Mood and Affect: Mood normal.        Behavior: Behavior normal.      UC Treatments / Results  Labs (all labs ordered are listed, but only abnormal results are displayed) Labs Reviewed - No data to  display  EKG   Radiology No results found.  Procedures Procedures (including critical care time)  Medications Ordered in UC Medications - No data to display  Initial Impression / Assessment and Plan / UC Course  I have reviewed the triage vital signs and the nursing notes.  Pertinent labs & imaging results that were available during my care of the patient were reviewed by me and considered in my medical decision making (see chart for details).  Patient presents for complaints of a "spider bite" that happened approximately 4 days ago.  Patient was seen at the [place] 1 day ago by her primary doctor, patient was started on Keflex.  She has completed 3 doses.  On exam, patient is hypotensive, with known history of hypertension.  She has not taken her medication today.  Also, patient appeared slow to respond to questions asked; however, this provider cannot determine if this is the patient's baseline or if this is part of her current symptoms.  Given the patient's noted swelling, hypotension, and questionable mentation, patient and mother were referred to the emergency department for further evaluation.  Patient and mother were in agreement with this plan of care and verbalized understanding.  BP was rechecked, BP was 104/57, rechecked prior to discharge and was 96/57.  Patient is stable for discharge to the emergency department.   Final Clinical Impressions(s) / UC Diagnoses   Final diagnoses:  None   Discharge Instructions   None    ED Prescriptions   None    PDMP not reviewed this encounter.   Abran Cantor, NP 01/06/23 [phone]

## 2023-01-06 NOTE — ED Notes (Signed)
Patient denies dizziness, SOB or chest pain.  States she does feel fatigued.

## 2023-01-09 ENCOUNTER — Other Ambulatory Visit: Payer: Self-pay

## 2023-01-09 ENCOUNTER — Encounter: Payer: Self-pay | Admitting: Emergency Medicine

## 2023-01-09 ENCOUNTER — Ambulatory Visit
Admission: EM | Admit: 2023-01-09 | Discharge: 2023-01-09 | Disposition: A | Discharge status: Home / Self Care | Payer: No Typology Code available for payment source | Attending: Nurse Practitioner | Admitting: Nurse Practitioner

## 2023-01-09 DIAGNOSIS — L03113 Cellulitis of right upper limb: Secondary | ICD-10-CM

## 2023-01-09 MED ORDER — CEFTRIAXONE SODIUM 1 G IJ SOLR
1.0000 g | Freq: Once | INTRAMUSCULAR | Status: AC
  Administered 2023-01-09: 1 g via INTRAMUSCULAR

## 2023-01-09 MED ORDER — CEPHALEXIN 500 MG PO CAPS: 500.0000 mg | ORAL_CAPSULE | Freq: Four times a day (QID) | ORAL | 0 refills | Status: AC

## 2023-01-09 NOTE — Discharge Instructions (Signed)
You were given an injection of ceftriaxone 1 g, which is an antibiotic.  Resume taking the Keflex on 01/10/2023. May take over-the-counter Tylenol or ibuprofen as needed for pain or discomfort. May apply cool compresses or ice packs to the right upper extremity to help with pain or swelling. As discussed, if symptoms or not improving over the next 24 to 48 hours, or if you develop worsening symptoms to include fever, chills, or change in your mental status, please go to the emergency department immediately for further evaluation. Please follow-up with your primary care physician within the next 7 to 10 days for reevaluation. Follow-up as needed.

## 2023-01-09 NOTE — ED Triage Notes (Signed)
Pt reports is inquiring about abx extension. Pt reports followed up with pcp and was started on abx post discharge from ED but reports "if I went back to them then it would have to be mailed and I feel like I need it sooner." Pt reports has 2 doses left on initial abx prescription.   Moderate swelling/redness noted to right hand and reports "it bust open" yesterday and swelling is now gone in right upper arm.

## 2023-01-09 NOTE — ED Provider Notes (Signed)
[facility]    CSN: [id number] Arrival date & time: 01/09/23  1023      History   Chief Complaint Chief Complaint  Patient presents with   Follow-up    HPI Melissa Meyers is a 57 y.o. female.   The history is provided by the patient.   The patient presents for complaints of continued swelling and redness in the right upper extremity.  Patient was seen in this clinic on 01/05/23 after she thinks she may have been bitten by a brown recluse.  At that time, patient presented hypotensive, with moderate swelling and redness in the right upper extremity that extended from the hand to the right elbow.  Patient was also slightly altered.  Patient was sent to the emergency department at that time.  The ER recommended admission/overnight observation for her current symptoms; however, patient elected to be discharged to home.  She has been taking Keflex 500 mg which was prescribed by her PCP.  She was not given additional antibiotics when she was discharged from the ER.  Today, patient returns requesting additional antibiotics.  She states that she only has 2 doses left of the original prescription.  She states that yesterday, she had a moderate amount of drainage from the area on the right middle finger.  She states that after the area drained, she has noticed decreased swelling in the right forearm up to the wrist, but states that she does have continued swelling from the wrist into the right hand.  She denies fever, chills, chest pain, abdominal pain, nausea, vomiting, or diarrhea.  Past Medical History:  Diagnosis Date   Acid reflux    Hypertension    Sinus drainage    Thyroid disease     There are no problems to display for this patient.   Past Surgical History:  Procedure Laterality Date   HAND SURGERY Left    TUBAL LIGATION      OB History   No obstetric history on file.      Home Medications    Prior to Admission medications   Medication Sig Start Date End Date  Taking? Authorizing Provider  cephALEXin (KEFLEX) 500 MG capsule Take 1 capsule (500 mg total) by mouth 4 (four) times daily for 7 days. 01/09/23 01/16/23 Yes Eshal Propps-Warren, Sadie Haber, NP  albuterol (VENTOLIN HFA) 108 (90 Base) MCG/ACT inhaler Inhale into the lungs every 6 (six) hours as needed for wheezing or shortness of breath.    [provider]  fluticasone (FLONASE) 50 MCG/ACT nasal spray Place 2 sprays into both nostrils at bedtime.  Patient not taking: Reported on 10/05/2020    [provider]  fluticasone-salmeterol (ADVAIR) 100-50 MCG/ACT AEPB Inhale 1 puff into the lungs 2 (two) times daily.    [provider]  gabapentin (NEURONTIN) 400 MG capsule Take 400 mg by mouth 3 (three) times daily.    [provider]  HYDROcodone-acetaminophen (NORCO/VICODIN) 5-325 MG tablet Take 1 tablet by mouth every 6 (six) hours as needed for severe pain. Patient not taking: Reported on 01/09/2023 01/06/23   Gerhard Munch, MD  ibuprofen (ADVIL,MOTRIN) 800 MG tablet Take 800 mg by mouth every 8 (eight) hours as needed.    [provider]  levothyroxine (SYNTHROID, LEVOTHROID) 50 MCG tablet Take 50 mcg by mouth daily before breakfast.    [provider]  Menthol-Methyl Salicylate (THERA-GESIC) 0.5-15 % CREA Apply topically.    [provider]  omeprazole (PRILOSEC) 20 MG capsule Take 40 mg by  mouth 2 (two) times daily.    [provider]  traMADol (ULTRAM) 50 MG tablet Take by mouth every 6 (six) hours as needed.    [provider]    Family History History reviewed. No pertinent family history.  Social History Social History   Tobacco Use   Smoking status: Former    Current packs/day: 1.00    Types: Cigarettes   Smokeless tobacco: Never  Vaping Use   Vaping status: Never Used  Substance Use Topics   Alcohol use: No   Drug use: No     Allergies   Penicillins   Review of Systems Review of Systems Per  HPI  Physical Exam Triage Vital Signs ED Triage Vitals [01/09/23 1036]  Encounter Vitals Group     BP (!) 103/57     Systolic BP Percentile      Diastolic BP Percentile      Pulse Rate 76     Resp 18     Temp 99.1 F (37.3 C)     Temp Source Oral     SpO2 94 %     Weight      Height      Head Circumference      Peak Flow      Pain Score 8     Pain Loc      Pain Education      Exclude from Growth Chart    No data found.  Updated Vital Signs BP (!) 103/57 (BP Location: Left Arm)   Pulse 76   Temp 99.1 F (37.3 C) (Oral)   Resp 18   SpO2 94%   Visual Acuity Right Eye Distance:   Left Eye Distance:   Bilateral Distance:    Right Eye Near:   Left Eye Near:    Bilateral Near:     Physical Exam Vitals and nursing note reviewed.  Constitutional:      General: She is not in acute distress.    Appearance: Normal appearance.  HENT:     Head: Normocephalic.  Eyes:     Extraocular Movements: Extraocular movements intact.     Pupils: Pupils are equal, round, and reactive to light.  Cardiovascular:     Rate and Rhythm: Normal rate and regular rhythm.     Pulses: Normal pulses.     Heart sounds: Normal heart sounds.  Pulmonary:     Effort: Pulmonary effort is normal. No respiratory distress.     Breath sounds: Normal breath sounds. No stridor. No wheezing, rhonchi or rales.  Abdominal:     General: Bowel sounds are normal.     Palpations: Abdomen is soft.     Tenderness: There is no abdominal tenderness.  Musculoskeletal:     Cervical back: Normal range of motion.  Lymphadenopathy:     Cervical: No cervical adenopathy.  Skin:    General: Skin is warm and dry.     Comments: Warmth, swelling, and tenderness noted to the right wrist/hand. Right middle finger with marked area of erythema with darkened central punctum.  +2 radial pulse.  Patient able to straighten right hand and move her fingers.  See attached images.  Neurological:     General: No focal deficit  present.     Mental Status: She is alert and oriented to person, place, and time.  Psychiatric:        Mood and Affect: Mood normal.        Behavior: Behavior normal.  UC Treatments / Results  Labs (all labs ordered are listed, but only abnormal results are displayed) Labs Reviewed - No data to display  EKG   Radiology No results found.  Procedures Procedures (including critical care time)  Medications Ordered in UC Medications  cefTRIAXone (ROCEPHIN) injection 1 g (has no administration in time range)    Initial Impression / Assessment and Plan / UC Course  I have reviewed the triage vital signs and the nursing notes.  Pertinent labs & imaging results that were available during my care of the patient were reviewed by me and considered in my medical decision making (see chart for details).  The patient is well-appearing, she is in no acute distress, BP is soft, but vital signs are otherwise stable.  Patient with continued cellulitis in the right upper extremity.  Per review of the ER chart, patient was advised to stay at the hospital overnight/admission for continued monitoring and evaluation.  Review of her labs does show an elevated white blood count.  Ceftriaxone 1 g IM administered.  Will continue Keflex 500 mg 4 times daily for the next 7 days.  Supportive care recommendations were provided and discussed with the patient to include over-the-counter analgesics for pain or discomfort, the application of ice to help with swelling, and to continue to monitor symptoms for worsening.  Patient advised if symptoms or not improving over the next 24 to 48 hours, it is recommended that she follow-up in the emergency department for further evaluation.  Patient was advised to follow-up with her primary care physician within the next 7 to 10 days for reevaluation.  Patient is in agreement with this plan of care and verbalizes understanding.  All questions were answered.   Patient stable for discharge.   Final Clinical Impressions(s) / UC Diagnoses   Final diagnoses:  Cellulitis of right arm     Discharge Instructions      You were given an injection of ceftriaxone 1 g, which is an antibiotic.  Resume taking the Keflex on 01/10/2023. May take over-the-counter Tylenol or ibuprofen as needed for pain or discomfort. May apply cool compresses or ice packs to the right upper extremity to help with pain or swelling. As discussed, if symptoms or not improving over the next 24 to 48 hours, or if you develop worsening symptoms to include fever, chills, or change in your mental status, please go to the emergency department immediately for further evaluation. Please follow-up with your primary care physician within the next 7 to 10 days for reevaluation. Follow-up as needed.     ED Prescriptions     Medication Sig Dispense Auth. Provider   cephALEXin (KEFLEX) 500 MG capsule Take 1 capsule (500 mg total) by mouth 4 (four) times daily for 7 days. 28 capsule Wilkie Zenon-Warren, Sadie Haber, NP      PDMP not reviewed this encounter.   Abran Cantor, NP 01/09/23 1104

## 2023-01-17 ENCOUNTER — Encounter (HOSPITAL_COMMUNITY): Payer: Self-pay | Admitting: Emergency Medicine

## 2023-01-17 ENCOUNTER — Emergency Department (HOSPITAL_COMMUNITY): Payer: No Typology Code available for payment source

## 2023-01-17 ENCOUNTER — Other Ambulatory Visit: Payer: Self-pay

## 2023-01-17 ENCOUNTER — Emergency Department (HOSPITAL_COMMUNITY)
Admission: EM | Admit: 2023-01-17 | Discharge: 2023-01-17 | Disposition: A | Discharge status: Home / Self Care | Payer: No Typology Code available for payment source | Attending: Emergency Medicine | Admitting: Emergency Medicine

## 2023-01-17 DIAGNOSIS — L02413 Cutaneous abscess of right upper limb: Secondary | ICD-10-CM | POA: Diagnosis not present

## 2023-01-17 DIAGNOSIS — M7989 Other specified soft tissue disorders: Secondary | ICD-10-CM | POA: Diagnosis present

## 2023-01-17 DIAGNOSIS — S60221A Contusion of right hand, initial encounter: Secondary | ICD-10-CM

## 2023-01-17 LAB — BASIC METABOLIC PANEL
Anion gap: 8 (ref 5–15)
BUN: 13 mg/dL (ref 6–20)
CO2: 27 mmol/L (ref 22–32)
Calcium: 9 mg/dL (ref 8.9–10.3)
Chloride: 103 mmol/L (ref 98–111)
Creatinine, Ser: 0.94 mg/dL (ref 0.44–1.00)
GFR, Estimated: >60 mL/min (ref >60)
Glucose, Bld: 97 mg/dL (ref 70–99)
Potassium: 3.9 mmol/L (ref 3.5–5.1)
Sodium: 138 mmol/L (ref 135–145)

## 2023-01-17 LAB — CBC WITH DIFFERENTIAL/PLATELET
Abs Immature Granulocytes: 0.05 10*3/uL (ref 0.00–0.07)
Basophils Absolute: 0.1 10*3/uL (ref 0.0–0.1)
Basophils Relative: 1 %
Eosinophils Absolute: 0.1 10*3/uL (ref 0.0–0.5)
Eosinophils Relative: 2 %
HCT: 40.8 % (ref 36.0–46.0)
Hemoglobin: 13.4 g/dL (ref 12.0–15.0)
Immature Granulocytes: 1 %
Lymphocytes Relative: 32 %
Lymphs Abs: 2.1 10*3/uL (ref 0.7–4.0)
MCH: 30.5 pg (ref 26.0–34.0)
MCHC: 32.8 g/dL (ref 30.0–36.0)
MCV: 92.7 fL (ref 80.0–100.0)
Monocytes Absolute: 0.4 10*3/uL (ref 0.1–1.0)
Monocytes Relative: 7 %
Neutro Abs: 3.7 10*3/uL (ref 1.7–7.7)
Neutrophils Relative %: 57 %
Platelets: 311 10*3/uL (ref 150–400)
RBC: 4.4 MIL/uL (ref 3.87–5.11)
RDW: 12.9 % (ref 11.5–15.5)
WBC: 6.4 10*3/uL (ref 4.0–10.5)
nRBC: 0 % (ref 0.0–0.2)

## 2023-01-17 MED ORDER — DOXYCYCLINE HYCLATE 100 MG PO TABS
100.0000 mg | ORAL_TABLET | Freq: Once | ORAL | Status: AC
  Administered 2023-01-17: 100 mg via ORAL
  Filled 2023-01-17: qty 1

## 2023-01-17 MED ORDER — DOXYCYCLINE HYCLATE 100 MG PO CAPS
100.0000 mg | ORAL_CAPSULE | Freq: Two times a day (BID) | ORAL | 0 refills | Status: DC
Start: 2023-01-17 — End: 2023-01-18

## 2023-01-17 MED ORDER — STERILE WATER FOR INJECTION IJ SOLN
INTRAMUSCULAR | Status: AC
  Filled 2023-01-17: qty 10

## 2023-01-17 MED ORDER — IOHEXOL 300 MG/ML  SOLN
75.0000 mL | Freq: Once | INTRAMUSCULAR | Status: AC | PRN
  Administered 2023-01-17: 75 mL via INTRAVENOUS

## 2023-01-17 MED ORDER — HYDROCODONE-ACETAMINOPHEN 5-325 MG PO TABS
1.0000 | ORAL_TABLET | Freq: Once | ORAL | Status: AC
  Administered 2023-01-17: 1 via ORAL
  Filled 2023-01-17: qty 1

## 2023-01-17 NOTE — ED Triage Notes (Signed)
Pt had insect bite to right hand 2 weeks ago. Pt has been seen by several doctors and had several rounds of antibiotics. Pt states that the swelling has improved but skin is peeling off top of hand and is  very thin. Pt states very painful and tingling in right hand.

## 2023-01-17 NOTE — ED Provider Notes (Cosign Needed Addendum)
Patient signed out to me at shift change from [facility], [place].  Patient has had an infection of her right dorsal proximal long finger, presumptively initiated by an insect bite while working in her garden.  She has been on a course of Keflex, she describes initially having pain and swelling that radiated up to her mid forearm, the finger infection has nearly completely resolved along with the forearm swelling, but now has developed additional new swelling at the dorsum of her hand.  Her hand is tight, tender, painful with attempts to make a fist.  Labs were obtained, initial conversation with Dr. Janee Morn, hand surgeon suggesting CT imaging for further definition.  This was obtained, it appears that she has a significant dorsal hand infection per CT results below.  Call placed to Dr. Janee Morn who recommended I&D here, no need to transfer to [facility] at this time but would follow patient as an outpatient.  Site was I&D by Dr. Hyacinth Meeker, this appears to have been a hematoma rather than an abscess.  We will however have her complete a course of doxycycline and plan close follow-up with Dr. Janee Morn in his office, information was given to patient who will call on Monday for an appointment with him.     Results for orders placed or performed during the hospital encounter of 01/17/23  CBC with Differential  Result Value Ref Range   WBC 6.4 4.0 - 10.5 K/uL   RBC 4.40 3.87 - 5.11 MIL/uL   Hemoglobin 13.4 12.0 - 15.0 g/dL   HCT 69.6 29.5 - 28.4 %   MCV 92.7 80.0 - 100.0 fL   MCH 30.5 26.0 - 34.0 pg   MCHC 32.8 30.0 - 36.0 g/dL   RDW 13.2 44.0 - 10.2 %   Platelets 311 150 - 400 K/uL   nRBC 0.0 0.0 - 0.2 %   Neutrophils Relative % 57 %   Neutro Abs 3.7 1.7 - 7.7 K/uL   Lymphocytes Relative 32 %   Lymphs Abs 2.1 0.7 - 4.0 K/uL   Monocytes Relative 7 %   Monocytes Absolute 0.4 0.1 - 1.0 K/uL   Eosinophils Relative 2 %   Eosinophils Absolute 0.1 0.0 - 0.5 K/uL   Basophils Relative 1 %   Basophils Absolute  0.1 0.0 - 0.1 K/uL   Immature Granulocytes 1 %   Abs Immature Granulocytes 0.05 0.00 - 0.07 K/uL  Basic metabolic panel  Result Value Ref Range   Sodium 138 135 - 145 mmol/L   Potassium 3.9 3.5 - 5.1 mmol/L   Chloride 103 98 - 111 mmol/L   CO2 27 22 - 32 mmol/L   Glucose, Bld 97 70 - 99 mg/dL   BUN 13 6 - 20 mg/dL   Creatinine, Ser 7.25 0.44 - 1.00 mg/dL   Calcium 9.0 8.9 - 36.6 mg/dL   GFR, Estimated >44 >03 mL/min   Anion gap 8 5 - 15   CT HAND RIGHT W CONTRAST  Result Date: 01/17/2023 CLINICAL DATA:  Right hand pain/swelling, insect bite EXAM: CT OF THE UPPER RIGHT EXTREMITY WITH CONTRAST TECHNIQUE: Multidetector CT imaging of the upper right extremity was performed according to the standard protocol following intravenous contrast administration. RADIATION DOSE REDUCTION: This exam was performed according to the departmental dose-optimization program which includes automated exposure control, adjustment of the mA and/or kV according to patient size and/or use of iterative reconstruction technique. CONTRAST:  75mL OMNIPAQUE IOHEXOL 300 MG/ML  SOLN COMPARISON:  Right hand radiographs dated 01/17/2023 FINDINGS: 6.4  cm (length) 3.7 cm (with) x 1.1 cm (depth) abscess along the dorsum of the hand, centered along the 2nd/3rd metacarpal, and extending distally along the 3rd metacarpal to the level of the 3rd metacarpal head (series 6/image 87). The abscess is 2-3 mm deep to the skin surface (series 9/image 141). No associated osseous irregularity/destruction to suggest osteomyelitis. No fracture or dislocation is seen. The joint spaces are preserved. IMPRESSION: 6.4 cm abscess along the dorsum of the hand, as above. No evidence of osteomyelitis. Electronically Signed   By: Charline Bills M.D.   On: 01/17/2023 17:49   DG Hand 2 View Right  Result Date: 01/17/2023 CLINICAL DATA:  Right hand pain and swelling.  Recent insect bite. EXAM: RIGHT HAND - 2 VIEW COMPARISON:  None Available. FINDINGS:  There is no evidence of fracture or dislocation. There is no evidence of arthropathy or other focal bone abnormality. Soft tissue swelling is seen along the dorsal aspect of the metacarpals. No evidence of soft tissue gas or radiopaque foreign body. IMPRESSION: Dorsal soft tissue swelling. No osseous abnormality. Electronically Signed   By: Danae Orleans M.D.   On: 01/17/2023 13:16        Victoriano Lain 01/17/23 2219    Burgess Amor, PA-C 01/17/23 2337    Eber Hong, MD 01/18/23 [phone]

## 2023-01-17 NOTE — ED Notes (Signed)
Paged Melissa Meyers a second time. [name]

## 2023-01-17 NOTE — ED Notes (Signed)
Incision and drainage performed to right hand by EDP. Cleanse hand with NS applied gauze, kling and tape. Patient tolerated procedure.

## 2023-01-17 NOTE — Discharge Instructions (Signed)
Complete the entire course of the antibiotics prescribed to make sure we are fully covering you until this hand is completely healed.

## 2023-01-17 NOTE — ED Notes (Signed)
Patient transported to CT 

## 2023-01-17 NOTE — Progress Notes (Signed)
Contacted by PA Cristi Loron regarding this patient.  History, photos, and labs reviewed.  Concern expressed for possible dorsal hand abscess.  Question for consideration was whether new oral antibiotics and outpatient f/u with hand surgery was acceptable option.  I recommended determining definitively today at [facility] whether there existed a significant infected fluid collection on the dorsum of the hand--information to determine if procedural indications were present.  Options suggested included: Performing dorsal hand I&D under local, which would provide the information and also provide needed treatement simultaneously. Aspirating dorsum of hand with large bore needle (18 gauge) Performing advanced imaging  I recommended placing new hand surgery consult if newly gained information indicated that procedural drainage would be helpful and if not already performed at the bedside (since this would be a simple subcutaneous abscess if it were an abscess at all)  Neil Crouch, MD Hand Surgery

## 2023-01-17 NOTE — ED Provider Notes (Signed)
[place] [facility] Provider Note   CSN: [id number] Arrival date & time: 01/17/23  4098     History  Chief Complaint  Patient presents with   Insect Bite    Melissa Meyers is a 57 y.o. female. Patient here for right hand swelling.  Was seen by her PCP for a insect bite to the middle finger of the right hand on 7/29, prescribed Keflex.  Had some worsening symptoms on 7/30 and came to the ER, was apparently somewhat confused and advised admission but she decided to go home to continue the Keflex.  She states after she come here she got a large amount of pus draining from the finger.  She started having some increased swelling to the hand on 8/2 and presented back to the urgent care at that time.  They gave her a dose of Rocephin and continued her Keflex for an additional week.  She states the finger is mostly better but now the dorsum of hand is more swollen and tender, feels slightly "tight" and the skin has peeled off of the back of her hand.  She denies fever or chills.  No new injuries or trauma.  HPI     Home Medications Prior to Admission medications   Medication Sig Start Date End Date Taking? Authorizing Provider  albuterol (VENTOLIN HFA) 108 (90 Base) MCG/ACT inhaler Inhale into the lungs every 6 (six) hours as needed for wheezing or shortness of breath.    [provider]  fluticasone (FLONASE) 50 MCG/ACT nasal spray Place 2 sprays into both nostrils at bedtime.  Patient not taking: Reported on 10/05/2020    [provider]  fluticasone-salmeterol (ADVAIR) 100-50 MCG/ACT AEPB Inhale 1 puff into the lungs 2 (two) times daily.    [provider]  gabapentin (NEURONTIN) 400 MG capsule Take 400 mg by mouth 3 (three) times daily.    [provider]  HYDROcodone-acetaminophen (NORCO/VICODIN) 5-325 MG tablet Take 1 tablet by mouth every 6 (six) hours as needed for severe pain. Patient not taking: Reported on 01/09/2023  01/06/23   Gerhard Munch, MD  ibuprofen (ADVIL,MOTRIN) 800 MG tablet Take 800 mg by mouth every 8 (eight) hours as needed.    [provider]  levothyroxine (SYNTHROID, LEVOTHROID) 50 MCG tablet Take 50 mcg by mouth daily before breakfast.    [provider]  Menthol-Methyl Salicylate (THERA-GESIC) 0.5-15 % CREA Apply topically.    [provider]  omeprazole (PRILOSEC) 20 MG capsule Take 40 mg by mouth 2 (two) times daily.    [provider]  traMADol (ULTRAM) 50 MG tablet Take by mouth every 6 (six) hours as needed.    [provider]      Allergies    Penicillins    Review of Systems   Review of Systems  Physical Exam Updated Vital Signs BP (!) 163/80 (BP Location: Left Arm)   Pulse (!) 49   Temp 98.1 F (36.7 C) (Oral)   Resp 16   Ht 5\' 4"  (1.626 m)   Wt 68.5 kg   SpO2 100%   BMI 25.92 kg/m  Physical Exam Vitals and nursing note reviewed.  Constitutional:      General: She is not in acute distress.    Appearance: She is well-developed.  HENT:     Head: Normocephalic and atraumatic.  Eyes:     Conjunctiva/sclera: Conjunctivae normal.  Cardiovascular:     Rate and Rhythm: Normal rate and regular rhythm.  Heart sounds: No murmur heard. Pulmonary:     Effort: Pulmonary effort is normal. No respiratory distress.     Breath sounds: Normal breath sounds.  Abdominal:     Palpations: Abdomen is soft.     Tenderness: There is no abdominal tenderness.  Musculoskeletal:        General: Swelling present.     Cervical back: Neck supple.  Skin:    General: Skin is warm and dry.     Capillary Refill: Capillary refill takes less than 2 seconds.  Neurological:     Mental Status: She is alert.  Psychiatric:        Mood and Affect: Mood normal.    ED Results / Procedures / Treatments   Labs (all labs ordered are listed, but only abnormal results are displayed) Labs Reviewed - No data to display  EKG None  Radiology No  results found.  Procedures Procedures    Medications Ordered in ED Medications - No data to display  ED Course/ Medical Decision Making/ A&P Clinical Course as of 01/17/23 1326  Sat Jan 17, 2023  1210 Patient here for right hand swelling.  Was seen by her PCP for a insect bite to the middle finger of the right hand on 7/29, prescribed Keflex.  Had some worsening symptoms on 7/30 and came to the ER, was apparently somewhat confused and advised admission but she decided to go home to continue the Keflex.  She states after she come here she got a large amount of pus draining from the finger.  She started having some increased swelling to the hand on 8/2 and presented back to the urgent care at that time.  They gave her a dose of Rocephin and continued her Keflex for an additional week.  She states the finger is mostly better but now the dorsum of hand is more swollen and tender, feels slightly "tight" and the skin has peeled off of the back of her hand.  Will obtain culture labs and x-ray. [CB]    Clinical Course User Index [CB] Ma Rings, PA-C                                 Medical Decision Making This patient presents to the ED for concern of R hand swelling, this involves an extensive number of treatment options, and is a complaint that carries with it a high risk of complications and morbidity.  The differential diagnosis includes abscess, cellulitis, compartment syndrome, other    Additional history obtained:  Additional history obtained from EMR External records from outside source obtained and reviewed including notes   Lab Tests:  I Ordered, and personally interpreted labs.  The pertinent results include: No leukocytosis, normal renal function   Imaging Studies ordered:  I ordered imaging studies including x-ray right hand which shows soft tissue swelling of the dorsum of the hand with no soft tissue gas, no osseous abnormality I independently visualized and  interpreted imaging within scope of identifying emergent findings  I agree with the radiologist interpretation   CT right hand show a 6.8 cm abscess, hand surgery reconsulted   Consultations Obtained:  I requested consultation with the hand surgeon ,  and discussed lab and imaging findings as well as pertinent plan - they recommend: Initially spoke with Dr. Janee Morn from hand surgery.  Recommended imaging to evaluate for abscess   Problem List / ED Course / Critical interventions /  Medication management  Abscess to right hand of dorsum.  Has been having present swelling of the dorsum of the hand, had drainage of wound from her finger but still having pain and tightness to the dorsum of the right hand.  CT shows an abscess.  I do not feel comfortable doing this in the ED will discuss with Dr. Janee Morn for hand surgery evaluation.  Patient aware she will likely need to go to [facility] for evaluation tonight I ordered medication including norco  for pain  Reevaluation of the patient after these medicines showed that the patient improved I have reviewed the patients home medicines and have made adjustments as needed  Signed out to Burgess Amor, PA-C      Amount and/or Complexity of Data Reviewed Labs: ordered. Radiology: ordered.  Risk Prescription drug management.           Final Clinical Impression(s) / ED Diagnoses Final diagnoses:  None    Rx / DC Orders ED Discharge Orders     None         Josem Kaufmann 01/17/23 1959    Terrilee Files, MD 01/18/23 [phone]

## 2023-01-18 ENCOUNTER — Telehealth (HOSPITAL_COMMUNITY): Payer: Self-pay | Admitting: Emergency Medicine

## 2023-01-18 MED ORDER — DOXYCYCLINE HYCLATE 100 MG PO CAPS: 100.0000 mg | ORAL_CAPSULE | Freq: Two times a day (BID) | ORAL | 0 refills | Status: AC

## 2023-01-18 NOTE — Telephone Encounter (Signed)
Needs prescription sent to a different pharmacy 

## 2023-01-18 NOTE — ED Notes (Signed)
Pt requested medication be changed to [facility] in [place]. Pt aware RX has been changed ,

## 2023-12-08 ENCOUNTER — Encounter: Payer: Self-pay | Admitting: Internal Medicine

## 2023-12-09 ENCOUNTER — Other Ambulatory Visit (HOSPITAL_COMMUNITY): Payer: Self-pay | Admitting: Internal Medicine

## 2023-12-09 DIAGNOSIS — Z1231 Encounter for screening mammogram for malignant neoplasm of breast: Secondary | ICD-10-CM

## 2024-04-13 ENCOUNTER — Encounter: Payer: Self-pay | Admitting: Family Medicine

## 2024-04-13 ENCOUNTER — Other Ambulatory Visit (HOSPITAL_COMMUNITY): Payer: Self-pay | Admitting: Family Medicine

## 2024-04-13 DIAGNOSIS — Z1231 Encounter for screening mammogram for malignant neoplasm of breast: Secondary | ICD-10-CM

## 2024-04-18 ENCOUNTER — Inpatient Hospital Stay
Admission: RE | Admit: 2024-04-18 | Discharge: 2024-04-18 | Disposition: A | Payer: Self-pay | Source: Ambulatory Visit | Attending: Family Medicine

## 2024-04-18 ENCOUNTER — Other Ambulatory Visit (HOSPITAL_COMMUNITY): Payer: Self-pay | Admitting: Family Medicine

## 2024-04-18 DIAGNOSIS — Z1231 Encounter for screening mammogram for malignant neoplasm of breast: Secondary | ICD-10-CM

## 2024-04-25 ENCOUNTER — Encounter (HOSPITAL_COMMUNITY): Payer: Self-pay

## 2024-04-25 ENCOUNTER — Inpatient Hospital Stay (HOSPITAL_COMMUNITY): Admission: RE | Admit: 2024-04-25 | Source: Ambulatory Visit
